# Patient Record
Sex: Female | Born: 1975
Health system: Southern US, Community
[De-identification: ages and names within clinical notes are randomized; demographics above are authoritative.]

## PROBLEM LIST (undated history)

## (undated) DIAGNOSIS — K582 Mixed irritable bowel syndrome: Secondary | ICD-10-CM

## (undated) DIAGNOSIS — F329 Major depressive disorder, single episode, unspecified: Secondary | ICD-10-CM

## (undated) DIAGNOSIS — F419 Anxiety disorder, unspecified: Secondary | ICD-10-CM

## (undated) DIAGNOSIS — D219 Benign neoplasm of connective and other soft tissue, unspecified: Secondary | ICD-10-CM

## (undated) DIAGNOSIS — G4733 Obstructive sleep apnea (adult) (pediatric): Secondary | ICD-10-CM

## (undated) DIAGNOSIS — D649 Anemia, unspecified: Secondary | ICD-10-CM

## (undated) DIAGNOSIS — M1990 Unspecified osteoarthritis, unspecified site: Secondary | ICD-10-CM

## (undated) DIAGNOSIS — N946 Dysmenorrhea, unspecified: Secondary | ICD-10-CM

## (undated) DIAGNOSIS — B019 Varicella without complication: Secondary | ICD-10-CM

## (undated) DIAGNOSIS — F32A Depression, unspecified: Secondary | ICD-10-CM

## (undated) DIAGNOSIS — N939 Abnormal uterine and vaginal bleeding, unspecified: Secondary | ICD-10-CM

## (undated) DIAGNOSIS — J45909 Unspecified asthma, uncomplicated: Secondary | ICD-10-CM

## (undated) DIAGNOSIS — J301 Allergic rhinitis due to pollen: Secondary | ICD-10-CM

## (undated) HISTORY — PX: ANTERIOR CRUCIATE LIGAMENT REPAIR: SHX115

## (undated) HISTORY — PX: EYE SURGERY: SHX253

## (undated) HISTORY — DX: Benign neoplasm of connective and other soft tissue, unspecified: D21.9

## (undated) HISTORY — DX: Unspecified osteoarthritis, unspecified site: M19.90

## (undated) HISTORY — DX: Allergic rhinitis due to pollen: J30.1

## (undated) HISTORY — PX: LAPAROSCOPIC BILATERAL SALPINGECTOMY: SHX5889

## (undated) HISTORY — DX: Dysmenorrhea, unspecified: N94.6

## (undated) HISTORY — DX: Anemia, unspecified: D64.9

## (undated) HISTORY — DX: Varicella without complication: B01.9

## (undated) HISTORY — PX: SALPINGECTOMY: SHX328

## (undated) HISTORY — DX: Obstructive sleep apnea (adult) (pediatric): G47.33

## (undated) HISTORY — DX: Abnormal uterine and vaginal bleeding, unspecified: N93.9

## (undated) HISTORY — DX: Mixed irritable bowel syndrome: K58.2

---

## 2010-02-28 HISTORY — PX: CHOLECYSTECTOMY: SHX55

## 2011-03-01 HISTORY — PX: LAPAROSCOPIC CHOLECYSTECTOMY: SUR755

## 2014-02-02 DIAGNOSIS — K219 Gastro-esophageal reflux disease without esophagitis: Secondary | ICD-10-CM | POA: Insufficient documentation

## 2014-02-02 DIAGNOSIS — G4733 Obstructive sleep apnea (adult) (pediatric): Secondary | ICD-10-CM | POA: Insufficient documentation

## 2014-02-02 DIAGNOSIS — F3340 Major depressive disorder, recurrent, in remission, unspecified: Secondary | ICD-10-CM | POA: Insufficient documentation

## 2014-02-02 DIAGNOSIS — F410 Panic disorder [episodic paroxysmal anxiety] without agoraphobia: Secondary | ICD-10-CM | POA: Insufficient documentation

## 2014-02-02 DIAGNOSIS — K582 Mixed irritable bowel syndrome: Secondary | ICD-10-CM | POA: Insufficient documentation

## 2014-02-02 HISTORY — DX: Mixed irritable bowel syndrome: K58.2

## 2014-02-02 HISTORY — DX: Obstructive sleep apnea (adult) (pediatric): G47.33

## 2014-02-13 DIAGNOSIS — E041 Nontoxic single thyroid nodule: Secondary | ICD-10-CM | POA: Insufficient documentation

## 2014-02-13 DIAGNOSIS — M5090 Cervical disc disorder, unspecified, unspecified cervical region: Secondary | ICD-10-CM | POA: Insufficient documentation

## 2014-05-21 ENCOUNTER — Encounter: Payer: Self-pay | Admitting: Obstetrics and Gynecology

## 2015-02-03 LAB — CBC AND DIFFERENTIAL
Neutrophils Absolute: 4
WBC: 7.1

## 2015-02-03 LAB — BASIC METABOLIC PANEL
BUN: 11 (ref 4–21)
CREATININE: 0.6 (ref 0.5–1.1)
GLUCOSE: 105
SODIUM: 139 (ref 137–147)

## 2015-02-03 LAB — HEPATIC FUNCTION PANEL: BILIRUBIN, TOTAL: 0.4

## 2015-02-03 LAB — IRON,TIBC AND FERRITIN PANEL
IRON: 114
UIBC: 221

## 2015-05-08 DIAGNOSIS — R5383 Other fatigue: Secondary | ICD-10-CM | POA: Insufficient documentation

## 2015-05-08 DIAGNOSIS — F419 Anxiety disorder, unspecified: Secondary | ICD-10-CM | POA: Insufficient documentation

## 2015-05-08 DIAGNOSIS — M722 Plantar fascial fibromatosis: Secondary | ICD-10-CM | POA: Insufficient documentation

## 2015-05-08 DIAGNOSIS — D509 Iron deficiency anemia, unspecified: Secondary | ICD-10-CM | POA: Insufficient documentation

## 2015-07-15 DIAGNOSIS — R233 Spontaneous ecchymoses: Secondary | ICD-10-CM | POA: Insufficient documentation

## 2015-07-15 DIAGNOSIS — D259 Leiomyoma of uterus, unspecified: Secondary | ICD-10-CM | POA: Insufficient documentation

## 2015-07-15 DIAGNOSIS — R238 Other skin changes: Secondary | ICD-10-CM | POA: Insufficient documentation

## 2015-07-31 DIAGNOSIS — R112 Nausea with vomiting, unspecified: Secondary | ICD-10-CM | POA: Insufficient documentation

## 2015-07-31 DIAGNOSIS — J45909 Unspecified asthma, uncomplicated: Secondary | ICD-10-CM | POA: Insufficient documentation

## 2015-08-17 DIAGNOSIS — Z9079 Acquired absence of other genital organ(s): Secondary | ICD-10-CM | POA: Insufficient documentation

## 2016-10-11 ENCOUNTER — Emergency Department (HOSPITAL_COMMUNITY)
Admission: EM | Admit: 2016-10-11 | Discharge: 2016-10-11 | Disposition: A | Discharge status: Home / Self Care | Payer: PRIVATE HEALTH INSURANCE | Attending: Physician Assistant | Admitting: Physician Assistant

## 2016-10-11 ENCOUNTER — Encounter (HOSPITAL_COMMUNITY): Payer: Self-pay

## 2016-10-11 ENCOUNTER — Other Ambulatory Visit: Payer: Self-pay | Admitting: Occupational Medicine

## 2016-10-11 ENCOUNTER — Ambulatory Visit: Payer: Self-pay

## 2016-10-11 ENCOUNTER — Emergency Department (HOSPITAL_COMMUNITY): Payer: PRIVATE HEALTH INSURANCE

## 2016-10-11 DIAGNOSIS — M542 Cervicalgia: Secondary | ICD-10-CM

## 2016-10-11 DIAGNOSIS — F329 Major depressive disorder, single episode, unspecified: Secondary | ICD-10-CM | POA: Diagnosis not present

## 2016-10-11 DIAGNOSIS — S161XXA Strain of muscle, fascia and tendon at neck level, initial encounter: Secondary | ICD-10-CM | POA: Diagnosis not present

## 2016-10-11 DIAGNOSIS — T148XXA Other injury of unspecified body region, initial encounter: Secondary | ICD-10-CM

## 2016-10-11 DIAGNOSIS — Y998 Other external cause status: Secondary | ICD-10-CM | POA: Insufficient documentation

## 2016-10-11 DIAGNOSIS — W01198A Fall on same level from slipping, tripping and stumbling with subsequent striking against other object, initial encounter: Secondary | ICD-10-CM | POA: Insufficient documentation

## 2016-10-11 DIAGNOSIS — Y929 Unspecified place or not applicable: Secondary | ICD-10-CM | POA: Insufficient documentation

## 2016-10-11 DIAGNOSIS — S0993XA Unspecified injury of face, initial encounter: Secondary | ICD-10-CM | POA: Diagnosis present

## 2016-10-11 DIAGNOSIS — J45909 Unspecified asthma, uncomplicated: Secondary | ICD-10-CM | POA: Diagnosis not present

## 2016-10-11 DIAGNOSIS — J3489 Other specified disorders of nose and nasal sinuses: Secondary | ICD-10-CM

## 2016-10-11 DIAGNOSIS — Y9389 Activity, other specified: Secondary | ICD-10-CM | POA: Diagnosis not present

## 2016-10-11 DIAGNOSIS — Z9049 Acquired absence of other specified parts of digestive tract: Secondary | ICD-10-CM | POA: Insufficient documentation

## 2016-10-11 DIAGNOSIS — F419 Anxiety disorder, unspecified: Secondary | ICD-10-CM | POA: Insufficient documentation

## 2016-10-11 DIAGNOSIS — S0083XA Contusion of other part of head, initial encounter: Secondary | ICD-10-CM | POA: Diagnosis not present

## 2016-10-11 DIAGNOSIS — W19XXXA Unspecified fall, initial encounter: Secondary | ICD-10-CM

## 2016-10-11 HISTORY — DX: Major depressive disorder, single episode, unspecified: F32.9

## 2016-10-11 HISTORY — DX: Depression, unspecified: F32.A

## 2016-10-11 HISTORY — DX: Unspecified asthma, uncomplicated: J45.909

## 2016-10-11 HISTORY — DX: Anxiety disorder, unspecified: F41.9

## 2016-10-11 MED ORDER — METHOCARBAMOL 500 MG PO TABS: 500.0000 mg | ORAL_TABLET | Freq: Two times a day (BID) | ORAL | 0 refills | Status: DC

## 2016-10-11 MED ORDER — IBUPROFEN 400 MG PO TABS
600.0000 mg | ORAL_TABLET | Freq: Once | ORAL | Status: AC
  Administered 2016-10-11: 600 mg via ORAL
  Filled 2016-10-11: qty 1

## 2016-10-11 NOTE — ED Notes (Signed)
Pt reports being sent over from [facility] where she was evaluated. Pt had Xray of nose and back complete. Pt was sent to have CT completed.

## 2016-10-11 NOTE — ED Notes (Signed)
Returned from CT.

## 2016-10-11 NOTE — ED Notes (Signed)
Pt verbalized understanding discharge instructions and denies any further needs or questions at this time. VS stable, ambulatory and steady gait.   

## 2016-10-11 NOTE — Discharge Instructions (Signed)
You can take Tylenol or Ibuprofen as directed for pain.  Take Robaxin as prescribed. This medication will may make you drowsy so do not drive or drink alcohol when taking it for the first time.   As we discussed, apply ice to the affected area for the first 48 hours. After that he may apply heat.  Return the emergency Department for any worsening pain, numbness/weakness of her arms or legs, urinary or bowel accidents, difficulty walking, difficulty breathing,or any other worsening or concerning symptoms.

## 2016-10-11 NOTE — ED Triage Notes (Signed)
Per Pt, Pt was walking to get on the elevator when her shoe caught and she ran into the wall with her face. Pt was unable to cath herself. She heard nose crack and has some swelling noted with a small laceration. Bleeding controlled.

## 2016-10-11 NOTE — ED Provider Notes (Signed)
[place] DEPT Provider Note   CSN: [id number] Arrival date & time: 10/11/16  9326  By signing my name below, I, Cindy Andrade, attest that this documentation has been prepared under the direction and in the presence of Providence Lanius, PA-C.  Electronically Signed: Reola Andrade, ED Scribe. 10/11/16. 12:45 PM.  History   Chief Complaint Chief Complaint  Patient presents with  . Fall   The history is provided by the patient. No language interpreter was used.    HPI Comments: Cindy Andrade is a 41 y.o. female with no pertinent PMHx, who presents to the Emergency Department complaining of sudden onset, worsening facial, neck, and b/l shoulder pain beginning s/p ground-level, mechanical fall which occurred PTA. Pt reports that she dropped an item, and while attempting to bend forward and grab this off the ground she tripped and fell forwards several feet, striking her face on a wall in front of her. She reports that she did hear a cracking noise upon impact with the wall within her face and she sustained a minor wound to the nose as well. No LOC. Pt reports that since the incident that she has experienced posterior neck and b/l shoulder pain. Pt was immediately evaluated in employee health where she had an XR performed; she was advised to come into the ED for CT imaging. Her pain is worse with movement of the torso and neck. No treatments for her pain were tried prior to coming into the ED. No PSHx to the back. No IVDU. She denies numbness, weakness, bowel/bladder incontinence, saddle anaesthesia/paraesthesias, or any other associated symptoms. Last tdap was in 2012.   Past Medical History:  Diagnosis Date  . Anxiety   . Asthma   . Depression    There are no active problems to display for this patient.  Past Surgical History:  Procedure Laterality Date  . ANTERIOR CRUCIATE LIGAMENT REPAIR    . CHOLECYSTECTOMY    . Salpingectomy     OB History    No data available     Home Medications    Prior to Admission medications   Medication Sig Start Date End Date Taking? Authorizing Provider  methocarbamol (ROBAXIN) 500 MG tablet Take 1 tablet (500 mg total) by mouth 2 (two) times daily. 10/11/16   Volanda Napoleon, PA-C   Family History No family history on file.  Social History Social History  Substance Use Topics  . Smoking status: Never Smoker  . Smokeless tobacco: Never Used  . Alcohol use Yes     Comment: Occasionally   Allergies   Neomycin and Other  Review of Systems Review of Systems  Constitutional: Negative for fever.  Respiratory: Negative for cough and shortness of breath.   Cardiovascular: Negative for chest pain.  Gastrointestinal: Negative for abdominal pain, nausea and vomiting.  Genitourinary: Negative for dysuria and hematuria.  Musculoskeletal: Positive for arthralgias, myalgias and neck pain.  Skin: Positive for wound.  Neurological: Negative for weakness, numbness and headaches.   Physical Exam Updated Vital Signs BP (!) 177/78 (BP Location: Right Arm)   Pulse 77   Temp 98.7 F (37.1 C) (Oral)   Resp 17   Ht 5\' 3"  (1.6 m)   Wt 111.1 kg (245 lb)   LMP 10/11/2016   SpO2 98%   BMI 43.40 kg/m   Physical Exam  Constitutional: She appears well-developed and well-nourished.  Sitting comfortably on examination table  HENT:  Head: Normocephalic.  No septal hematoma. Small, <1cm superficial abrasion tot he  nasal bridge. Some diffuse swelling to the nasal bridge. No facial instability. No trismus. Full elevation/depression of mandible intact without difficult. No tenderness to palpation of skull. No deformities or crepitus noted. No open wounds, abrasions or lacerations.   Eyes: Pupils are equal, round, and reactive to light. Conjunctivae and EOM are normal. Right eye exhibits no discharge. Left eye exhibits no discharge. No scleral icterus.  Neck: Normal range of motion.  Full flexion/extension and lateral movement of  neck fully intact. Some midline tenderness to the mid-cervical region. No deformities or crepitus.   Cardiovascular: Normal rate, regular rhythm and normal heart sounds.   No murmur heard. Pulses:      Radial pulses are 2+ on the right side, and 2+ on the left side.  Pulmonary/Chest: Effort normal and breath sounds normal. No respiratory distress. She has no wheezes. She has no rales.  No evidence of respiratory distress. Able to speak in full sentences without difficulty.  Musculoskeletal:  No tenderness to palpation to bilateral knees and ankles. No deformities or crepitus noted. FROM of BLE without any difficulty. No tenderness to palpation to bilateral shoulders, clavicles, elbows, and wrists. No deformities or crepitus noted. FROM of BUE without difficulty.   Neurological: She is alert.  Skin: Skin is warm and dry.  Psychiatric: She has a normal mood and affect. Her speech is normal and behavior is normal.  Nursing note and vitals reviewed.  ED Treatments / Results  DIAGNOSTIC STUDIES: Oxygen Saturation is 98% on RA, normal by my interpretation.   COORDINATION OF CARE: 12:45 PM-Discussed next steps with pt. Pt verbalized understanding and is agreeable with the plan.   Labs (all labs ordered are listed, but only abnormal results are displayed) Labs Reviewed - No data to display  EKG  EKG Interpretation None      Radiology Dg Nasal Bones  Result Date: 10/11/2016 CLINICAL DATA:  Status post fall today striking the nose EXAM: NASAL BONES - 3+ VIEW COMPARISON:  None in PACs FINDINGS: There are lucencies within the nasal bone which appear to reflect sutures. I cannot exclude a nondisplaced fracture however. The nasal spine appears intact. The nasal septum is deviated slightly toward the right. The ethmoid and maxillary sinuses appear clear. IMPRESSION: There is no acute displaced nasal bone fracture. I cannot exclude a non depressed fracture. Electronically Signed   By: David   Martinique M.D.   On: 10/11/2016 08:54   Dg Cervical Spine Complete  Addendum Date: 10/11/2016   ADDENDUM REPORT: 10/11/2016 09:22 ADDENDUM: Critical Value/emergent results were called by telephone at the time of interpretation on 10/11/2016 at 9:21 am to Dr. Maylon Peppers, who verbally acknowledged these results. Electronically Signed   By: Marijo Conception, M.D.   On: 10/11/2016 09:22   Result Date: 10/11/2016 CLINICAL DATA:  Neck pain after fall today. EXAM: CERVICAL SPINE - COMPLETE 4+ VIEW COMPARISON:  None. FINDINGS: Posterior angulation of the dens is noted with slight irregularity seen anteriorly suggesting possible fracture. Mild reversal of normal lordosis is noted most likely positional in origin. Moderate degenerative disc disease is noted at C4-5, C5-6 and C6-7 with anterior osteophyte formation. Moderate neural foraminal stenosis is noted on the right at C6-7 secondary to uncovertebral spurring. IMPRESSION: Multilevel degenerative disc disease is noted. Moderate right-sided neural foraminal stenosis is noted at C6-7 secondary to uncovertebral spurring. Posterior angulation of the dens is noted with slight irregularity seen anteriorly suggesting possible fracture; CT scan is recommended for further evaluation. All attempts to reach someone  at the phone number for Occupational Health at [facility] or unsuccessful. I left voicemail messages with the receptionist and nurse that I needed to speak immediately with Dr. Geoffry Paradise. These results will be called to the ordering clinician or representative by the Radiologist Assistant, and communication documented in the PACS or zVision Dashboard. Electronically Signed: By: Marijo Conception, M.D. On: 10/11/2016 09:09   Ct Cervical Spine Wo Contrast  Result Date: 10/11/2016 CLINICAL DATA:  [place] and injured neck. EXAM: CT CERVICAL SPINE WITHOUT CONTRAST TECHNIQUE: Multidetector CT imaging of the cervical spine was performed without intravenous contrast. Multiplanar CT image  reconstructions were also generated. COMPARISON:  Cervical spine radiographs 10/11/2016 FINDINGS: Alignment: Reversal of the normal cervical lordosis likely due to the head for position the patient. This could also be seen with muscle spasm or pain. The overall alignment is maintained. Skull base and vertebrae: No acute fracture is identified. Soft tissues and spinal canal: No abnormal prevertebral soft tissue swelling. The spinal canal is generous in is no significant spinal stenosis. Disc levels: No significant disc protrusions or canal stenosis. Advanced degenerative changes for age with osteophytic spurring/marginal osteophytes. Significant uncinate spurring changes on the right side the the at C6-7 with moderate right foraminal stenosis. Midline calcifications suspicious for early OPLL. Upper chest: No significant findings. Other: 3 cm right thyroid mass. Recommend ultrasound correlation and consideration for biopsy. IMPRESSION: 1. Reversal of the normal cervical lordosis likely due to positioning, muscle spasm or pain. The overall alignment is maintained. 2. Age advanced degenerative changes but no acute fracture. 3. Significant uncinate spurring changes on the right at C6-7 with moderate right foraminal stenosis. 4. **An incidental finding of potential clinical significance has been found. 3 cm right thyroid lobe mass. Recommend ultrasound correlation and consideration for biopsy.** Electronically Signed   By: Marijo Sanes M.D.   On: 10/11/2016 14:20   Procedures Procedures   Medications Ordered in ED Medications  ibuprofen (ADVIL,MOTRIN) tablet 600 mg (600 mg Oral Given 10/11/16 1304)    Initial Impression / Assessment and Plan / ED Course  I have reviewed the triage vital signs and the nursing notes.  Pertinent labs & imaging results that were available during my care of the patient were reviewed by me and considered in my medical decision making (see chart for details).     41 year old  female who presents after a mechanical fall that occurred earlier this morning. Patient had neck x-ray done at employee health and there is concerned about a possible dens fracture that could not be definitely confirmed. Patient was sent to the emergency department for further CT imaging. Patient is afebrile, non-toxic appearing, sitting comfortably on examination table. Vital signs reviewed. Patient slightly hypertensive, likely secondary to pain. No neuro deficits on exam. No history of LOC, vomiting, abnormal behavior. No difficulty in relating. Plan to obtain CT cervical spine for further evaluation. Given reassuring physical exam and per [facility] CT criteria, no imaging is indicated at this time. Patient had nasal x-rays performed and play health were negative for any acute fracture or dislocation. There is no evidence of a septal hematoma. Discussed further imaging of the nasal bone, but explained that with the swelling, there may not be an acute finding. Patient wishes to decline at this time. Analgesics provided the department.  CT reviewed. Negative for any acute fracture. There is mention of possible muscle spasm. There is also an incidental finding of a 3cm thyroid nodule mass that is recommended to have outpatient ultrasound  and biopsy done. Discussed results with patient. Patient states that she is already aware of the thyroid nodule and has been evaluated by ultrasound and biopsy for it. Patient hemodynamically stable. Patient is following up with ENT as per employee health recommendation. Will provide symptomatic relief. Conservative therapies discussed. Strict return precautions discussed. Patient expresses understanding and agreement to plan.    Final Clinical Impressions(s) / ED Diagnoses   Final diagnoses:  Fall, initial encounter  Contusion of face, initial encounter  Neck pain  Muscle strain   New Prescriptions New Prescriptions   METHOCARBAMOL (ROBAXIN) 500 MG TABLET    Take  1 tablet (500 mg total) by mouth 2 (two) times daily.   I personally performed the services described in this documentation, which was scribed in my presence. The recorded information has been reviewed and is accurate.     Volanda Napoleon, PA-C 10/11/16 1727    Volanda Napoleon, PA-C 10/11/16 1728    Volanda Napoleon, PA-C 10/11/16 1729    Macarthur Critchley, MD 10/12/16 1050

## 2016-10-17 DIAGNOSIS — S022XXD Fracture of nasal bones, subsequent encounter for fracture with routine healing: Secondary | ICD-10-CM | POA: Insufficient documentation

## 2016-10-17 DIAGNOSIS — J343 Hypertrophy of nasal turbinates: Secondary | ICD-10-CM | POA: Insufficient documentation

## 2016-10-17 DIAGNOSIS — J342 Deviated nasal septum: Secondary | ICD-10-CM | POA: Insufficient documentation

## 2016-11-01 DIAGNOSIS — F4323 Adjustment disorder with mixed anxiety and depressed mood: Secondary | ICD-10-CM | POA: Diagnosis not present

## 2016-11-10 DIAGNOSIS — M5012 Mid-cervical disc disorder, unspecified level: Secondary | ICD-10-CM | POA: Diagnosis not present

## 2016-11-10 DIAGNOSIS — S060X1A Concussion with loss of consciousness of 30 minutes or less, initial encounter: Secondary | ICD-10-CM | POA: Diagnosis not present

## 2016-11-10 DIAGNOSIS — S134XXA Sprain of ligaments of cervical spine, initial encounter: Secondary | ICD-10-CM | POA: Diagnosis not present

## 2016-11-10 DIAGNOSIS — M5412 Radiculopathy, cervical region: Secondary | ICD-10-CM | POA: Diagnosis not present

## 2016-11-15 DIAGNOSIS — F4323 Adjustment disorder with mixed anxiety and depressed mood: Secondary | ICD-10-CM | POA: Diagnosis not present

## 2016-11-24 ENCOUNTER — Ambulatory Visit: Payer: PRIVATE HEALTH INSURANCE | Attending: Family Medicine | Admitting: Physical Therapy

## 2016-11-24 ENCOUNTER — Encounter: Payer: Self-pay | Admitting: Physical Therapy

## 2016-11-24 DIAGNOSIS — M6281 Muscle weakness (generalized): Secondary | ICD-10-CM | POA: Insufficient documentation

## 2016-11-24 DIAGNOSIS — M25512 Pain in left shoulder: Secondary | ICD-10-CM | POA: Diagnosis present

## 2016-11-24 DIAGNOSIS — M25511 Pain in right shoulder: Secondary | ICD-10-CM | POA: Diagnosis present

## 2016-11-24 DIAGNOSIS — M542 Cervicalgia: Secondary | ICD-10-CM | POA: Insufficient documentation

## 2016-11-24 NOTE — Therapy (Addendum)
[place], [place], [address] Phone: [phone]   Fax:  [phone]  Physical Therapy Evaluation  Patient Details  Name: Cindy Andrade MRN: [id number] Date of Birth: 07-30-1975 Referring Provider: Odis Luster, MD  Encounter Date: 11/24/2016      PT End of Session - 11/24/16 1417    Visit Number 1   Number of Visits 6   Date for PT Re-Evaluation 12/21/16   Authorization Type Workers comp- [employer] [facility])   phone [phone], [phone]  Claim #[id number]   Authorization Time Period 6 visits   PT Start Time 1330   PT Stop Time 1417   PT Time Calculation (min) 47 min   Activity Tolerance Patient tolerated treatment well   Behavior During Therapy [facility] for tasks assessed/performed      Past Medical History:  Diagnosis Date  . Anxiety   . Asthma   . Depression     Past Surgical History:  Procedure Laterality Date  . ANTERIOR CRUCIATE LIGAMENT REPAIR    . CHOLECYSTECTOMY    . Salpingectomy      There were no vitals filed for this visit.       Subjective Assessment - 11/24/16 1334    Subjective Has had neck pain for years but is not back to baseline at this point. Has been seeing a chiropractor but it does not seem to be getting better this time around. No pain in neutral, still posture but feels very tight. Neck overall just doesn't feel mobile. Has had an increase in HA pain, around ears and to forehead. Reports N/T bill that has increased since accident, feels that it is more the L than R, seems to be positionally dependent. 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    Patient Stated Goals turning R/L, working, household chores, reduce HA pain   Currently in Pain? Yes   Pain Score 1    Pain Location Neck   Pain Orientation Right;Left   Pain Descriptors / Indicators Tightness   Pain Type Acute pain   Aggravating Factors  turning head, sitting  long periods   Pain Relieving Factors motrin            [facility] PT Assessment - 11/24/16 0001      Assessment   Medical Diagnosis neck pain   Referring Provider Odis Luster, MD   Onset Date/Surgical Date 10/11/16   Hand Dominance Right   Next MD Visit --  3 weeks   Prior Therapy --  not PT, has had chiropractic     Precautions   Precautions None     Restrictions   Weight Bearing Restrictions No     Balance Screen   Has the patient fallen in the past 6 months Yes   How many times? 1   Has the patient had a decrease in activity level because of a fear of falling?  No   Is the patient reluctant to leave their home because of a fear of falling?  No     Home Ecologist residence     Prior Function   Level of Independence Independent   Vocation Full time employment   Lobbyist     Cognition   Overall Cognitive Status Within Functional Limits for tasks assessed     Observation/Other Assessments   Focus on Therapeutic Outcomes (FOTO)  35% limitation     Sensation   Additional  Comments occasional- bilat hands     ROM / Strength   AROM / PROM / Strength AROM;Strength     AROM   AROM Assessment Site Cervical   Cervical Flexion 42   Cervical Extension 50   Cervical - Right Side Bend 32  tight   Cervical - Left Side Bend 34  tight   Cervical - Right Rotation 36   Cervical - Left Rotation 45  pain on R     Strength   Strength Assessment Site Shoulder   Right/Left Shoulder Right;Left   Right Shoulder External Rotation 3+/5   Left Shoulder External Rotation 3+/5            Objective measurements completed on examination: See above findings.          [place] Adult PT Treatment/Exercise - 11/24/16 0001      Therapeutic Activites    Therapeutic Activities Work [employer]   Work Customer service manager up     Exercises   Exercises Shoulder     Shoulder Exercises: Seated   Retraction  Limitations scapular retraction   External Rotation 20 reps   Theraband Level (Shoulder External Rotation) Level 1 (Yellow)     Shoulder Exercises: Stretch   Other Shoulder Stretches upper trap & levator stretch                PT Education - 11/24/16 1417    Education provided Yes   Education Details theracane, tennis ball, ergonomics, anatomy of condition, POC, HEP, exercise form/rationale   Person(s) Educated Patient   Methods Explanation;Demonstration;Tactile cues;Verbal cues;Handout   Comprehension Verbalized understanding;Returned demonstration;Verbal cues required;Tactile cues required;Need further instruction             PT Long Term Goals - 11/24/16 1424      PT LONG TERM GOAL #1   Title Bilateral cervical rotation to at least 50 deg for functional rotation    Baseline see flowsheet   Time 4   Period Weeks   Status New   Target Date 12/23/16     PT LONG TERM GOAL #2   Title Pt will be able to reach overhead for high cabinets without increase in neck/shoulder pain   Baseline pain at eval   Time 4   Period Weeks   Status New   Target Date 12/23/16     PT LONG TERM GOAL #3   Title Pt will be able to complete all work related activities without limitation by neck/shoulder pain   Baseline limited at eval   Time 4   Period Weeks   Status New   Target Date 12/23/16     PT LONG TERM GOAL #4   Title Pt will report resolution of HA pain caused by myofascial pain referred from neck/shoulders   Baseline frequent HA since accident in rams horn pattern   Time 4   Period Weeks   Status New   Target Date 12/23/16     PT LONG TERM GOAL #5   Title GHJ ER strength to 5/5 to indicate appropriate strength for periscapular support   Baseline 3+/5 at eval   Time 4   Period Weeks   Status New   Target Date 12/23/16                Plan - 11/24/16 1346    Clinical Impression Statement Pt presents to PT with complaints of cervical pain extending into  bilateral UE that increased after a fall about 1 month ago. Notable  tightness along bilateral upper traps and levator scapula with forward head and excessively erect posture. Weakness in bilateral GHJ ER indicating poor periscapular support. Pt works on a computer all day at work and Personal assistant were discussed today. Pt will benefit from skilled PT in order to improve periscapular strength/endurance for proper postural alignment and manual techniques to decrease myofascial pain.    History and Personal Factors relevant to plan of care: depression, anxiety, bilat ACL recon 09 & 11, h/o asthma, goiter   Clinical Presentation Stable   Clinical Presentation due to: n/a   Clinical Decision Making Low   Rehab Potential Good   PT Frequency 2x / week   PT Duration 3 weeks   PT Treatment/Interventions ADLs/Self Care Home Management;Cryotherapy;Electrical Stimulation;Iontophoresis 4mg /ml Dexamethasone;Functional mobility training;Ultrasound;Traction;Moist Heat;Therapeutic activities;Therapeutic exercise;Neuromuscular re-education;Patient/family education;Passive range of motion;Manual techniques;Dry needling;Taping   PT Next Visit Plan DN bilat upper traps, deep neck flexor training, periscapular strengthening, pectoralis stretching, changed posture at work?   PT Home Exercise Plan scapular retraction, bil UE ER, upper trap & levator stretch   Consulted and Agree with Plan of Care Patient      Patient will benefit from skilled therapeutic intervention in order to improve the following deficits and impairments:  Decreased range of motion, Impaired UE functional use, Increased muscle spasms, Decreased activity tolerance, Pain, Impaired flexibility, Improper body mechanics, Postural dysfunction, Impaired sensation, Decreased strength  Visit Diagnosis: Cervicalgia - Plan: PT plan of care cert/re-cert  Acute pain of right shoulder - Plan: PT plan of care cert/re-cert  Acute pain of left shoulder - Plan: PT  plan of care cert/re-cert  Muscle weakness (generalized) - Plan: PT plan of care cert/re-cert     Problem List There are no active problems to display for this patient.   Elmus Mathes C. Jveon Pound PT, DPT 11/24/16 5:32 PM   [place] [facility] [address] [place], [place], [address] Phone: [phone]   Fax:  [phone]  Name: Tovah Slavick MRN: [id number] Date of Birth: 1975/04/05

## 2016-11-29 ENCOUNTER — Ambulatory Visit: Payer: PRIVATE HEALTH INSURANCE | Attending: Family Medicine | Admitting: Physical Therapy

## 2016-11-29 ENCOUNTER — Encounter: Payer: Self-pay | Admitting: Physical Therapy

## 2016-11-29 DIAGNOSIS — M25512 Pain in left shoulder: Secondary | ICD-10-CM | POA: Diagnosis present

## 2016-11-29 DIAGNOSIS — M6281 Muscle weakness (generalized): Secondary | ICD-10-CM | POA: Diagnosis present

## 2016-11-29 DIAGNOSIS — M25511 Pain in right shoulder: Secondary | ICD-10-CM | POA: Insufficient documentation

## 2016-11-29 DIAGNOSIS — M542 Cervicalgia: Secondary | ICD-10-CM | POA: Diagnosis present

## 2016-11-29 DIAGNOSIS — F4323 Adjustment disorder with mixed anxiety and depressed mood: Secondary | ICD-10-CM | POA: Diagnosis not present

## 2016-11-29 NOTE — Therapy (Signed)
[place], [place], [address] Phone: [phone]   Fax:  [phone]  Physical Therapy Treatment  Patient Details  Name: Cindy Andrade MRN: [id number] Date of Birth: 1975-04-06 Referring Provider: Odis Luster, MD  Encounter Date: 11/29/2016      PT End of Session - 11/29/16 0927    Visit Number 2   Number of Visits 6   Date for PT Re-Evaluation 12/21/16   Authorization Type Workers comp- [employer] [facility])   phone [phone], [phone]  Claim #[id number]   Authorization Time Period 6 visits   PT Start Time 0930   PT Stop Time 1023   PT Time Calculation (min) 53 min   Activity Tolerance Patient tolerated treatment well   Behavior During Therapy [facility] for tasks assessed/performed      Past Medical History:  Diagnosis Date  . Anxiety   . Asthma   . Depression     Past Surgical History:  Procedure Laterality Date  . ANTERIOR CRUCIATE LIGAMENT REPAIR    . CHOLECYSTECTOMY    . Salpingectomy      There were no vitals filed for this visit.      Subjective Assessment - 11/29/16 0930    Subjective Reports neck feels sore after doing scpaular retractions. Points to R upper trap to indicate site of pain today. A little L-sided HA today.    Patient Stated Goals turning R/L, working, household chores, reduce HA pain            [facility] PT Assessment - 11/29/16 0001      AROM   Cervical - Right Rotation 52   Cervical - Left Rotation 50                     [facility] Adult PT Treatment/Exercise - 11/29/16 0001      Shoulder Exercises: Seated   Other Seated Exercises cervical retraction   Other Seated Exercises cervical rotation SNAGs     Shoulder Exercises: Stretch   Other Shoulder Stretches upper trap & levator stretch   Other Shoulder Stretches thoracic ext in chair     Modalities   Modalities Moist Heat     Moist Heat Therapy   Number Minutes Moist Heat 10  Minutes   Moist Heat Location Cervical     Manual Therapy   Manual Therapy Soft tissue mobilization   Manual therapy comments skilled palpation & monitoring during TPDN   Soft tissue mobilization bilateral upper traps & levator           Trigger Point Dry Needling - 11/29/16 1314    Consent Given? Yes   Education Handout Provided --  verbal education   Muscles Treated Upper Body Upper trapezius   Upper Trapezius Response Twitch reponse elicited;Palpable increased muscle length              PT Education - 11/29/16 1014    Education provided Yes   Education Details TPDN & expected outcomes, rationale for exercises, importance of regular mobility   Person(s) Educated Patient   Methods Explanation;Demonstration;Tactile cues;Verbal cues;Handout   Comprehension Verbalized understanding;Returned demonstration;Verbal cues required;Tactile cues required;Need further instruction             PT Long Term Goals - 11/24/16 1424      PT LONG TERM GOAL #1   Title Bilateral cervical rotation to at least 50 deg for functional rotation    Baseline see flowsheet   Time 4  Period Weeks   Status New   Target Date 12/23/16     PT LONG TERM GOAL #2   Title Pt will be able to reach overhead for high cabinets without increase in neck/shoulder pain   Baseline pain at eval   Time 4   Period Weeks   Status New   Target Date 12/23/16     PT LONG TERM GOAL #3   Title Pt will be able to complete all work related activities without limitation by neck/shoulder pain   Baseline limited at eval   Time 4   Period Weeks   Status New   Target Date 12/23/16     PT LONG TERM GOAL #4   Title Pt will report resolution of HA pain caused by myofascial pain referred from neck/shoulders   Baseline frequent HA since accident in rams horn pattern   Time 4   Period Weeks   Status New   Target Date 12/23/16     PT LONG TERM GOAL #5   Title GHJ ER strength to 5/5 to indicate appropriate  strength for periscapular support   Baseline 3+/5 at eval   Time 4   Period Weeks   Status New   Target Date 12/23/16               Plan - 11/29/16 1015    Clinical Impression Statement Concordant pain found in trigger points of upper traps and reduced with TPDN. Pt verbalized soreness as expected and was educated on importance of frequent mobility and exercises to reduce soreness.    PT Treatment/Interventions ADLs/Self Care Home Management;Cryotherapy;Electrical Stimulation;Iontophoresis 4mg /ml Dexamethasone;Functional mobility training;Ultrasound;Traction;Moist Heat;Therapeutic activities;Therapeutic exercise;Neuromuscular re-education;Patient/family education;Passive range of motion;Manual techniques;Dry needling;Taping   PT Next Visit Plan DN outcomes?  deep neck flexor training, periscapular strengthening, pectoralis stretching, posture/exercises at work?   PT Home Exercise Plan scapular retraction, bil UE ER, upper trap & levator stretch, cervical rotation SNAG   Consulted and Agree with Plan of Care Patient      Patient will benefit from skilled therapeutic intervention in order to improve the following deficits and impairments:  Decreased range of motion, Impaired UE functional use, Increased muscle spasms, Decreased activity tolerance, Pain, Impaired flexibility, Improper body mechanics, Postural dysfunction, Impaired sensation, Decreased strength  Visit Diagnosis: Cervicalgia  Acute pain of right shoulder  Acute pain of left shoulder  Muscle weakness (generalized)     Problem List There are no active problems to display for this patient.   Sharlett Lienemann C. Reynol Arnone PT, DPT 11/29/16 1:15 PM   [facility] [address] [place], [place], [address] Phone: [phone]   Fax:  [phone]  Name: Cindy Andrade MRN: [id number] Date of Birth: 14-Dec-1975

## 2016-12-01 ENCOUNTER — Ambulatory Visit: Payer: PRIVATE HEALTH INSURANCE | Attending: Family Medicine | Admitting: Physical Therapy

## 2016-12-01 ENCOUNTER — Encounter: Payer: Self-pay | Admitting: Physical Therapy

## 2016-12-01 DIAGNOSIS — M25511 Pain in right shoulder: Secondary | ICD-10-CM

## 2016-12-01 DIAGNOSIS — M542 Cervicalgia: Secondary | ICD-10-CM | POA: Diagnosis present

## 2016-12-01 DIAGNOSIS — M6281 Muscle weakness (generalized): Secondary | ICD-10-CM | POA: Diagnosis present

## 2016-12-01 DIAGNOSIS — M25512 Pain in left shoulder: Secondary | ICD-10-CM | POA: Diagnosis present

## 2016-12-01 NOTE — Therapy (Signed)
[place], [place], [address] Phone: [phone]   Fax:  [phone]  Physical Therapy Treatment  Patient Details  Name: Cindy Andrade MRN: [id number] Date of Birth: 04-05-1975 Referring Provider: Odis Luster, MD  Encounter Date: 12/01/2016      PT End of Session - 12/01/16 1148    Visit Number 3   Number of Visits 6   Date for PT Re-Evaluation 12/21/16   Authorization Type Workers comp- [employer] [facility])   phone [phone], [phone]  Claim #[id number]   PT Start Time 1146   PT Stop Time 1229   PT Time Calculation (min) 43 min   Activity Tolerance Patient tolerated treatment well   Behavior During Therapy [facility] for tasks assessed/performed      Past Medical History:  Diagnosis Date  . Anxiety   . Asthma   . Depression     Past Surgical History:  Procedure Laterality Date  . ANTERIOR CRUCIATE LIGAMENT REPAIR    . CHOLECYSTECTOMY    . Salpingectomy      There were no vitals filed for this visit.      Subjective Assessment - 12/01/16 1148    Subjective Was very sore in bilateral upper traps. Felt a little "kink" when she turned to the R but it has calmed down. Today does not have pain in neck, soreness in shoulders is minimal.    Patient Stated Goals turning R/L, working, household chores, reduce HA pain   Currently in Pain? No/denies                         [facility] Adult PT Treatment/Exercise - 12/01/16 0001      Shoulder Exercises: Supine   Protraction 15 reps   Other Supine Exercises cerviccal retraction   Other Supine Exercises abdominal engagement +scapular retraction     Shoulder Exercises: Standing   External Rotation 20 reps;Both  single arm   Theraband Level (Shoulder External Rotation) Level 2 (Red)   Extension 20 reps   Theraband Level (Shoulder Extension) Level 2 (Red)     Shoulder Exercises: Stretch   Other Shoulder Stretches supine  pec stretch     Manual Therapy   Manual Therapy Joint mobilization;Manual Traction;Taping   Joint Mobilization cervical & lateral mobilizations gross cervical   Soft tissue mobilization bilaterall upper traps   Manual Traction cervical   Kinesiotex Facilitate Muscle     Kinesiotix   Facilitate Muscle  scapular retraction                PT Education - 12/01/16 1244    Education provided Yes   Education Details posture beginning from hips, DOMS, postural awareness, exercise form/rationale   Person(s) Educated Patient   Methods Explanation;Demonstration;Tactile cues;Verbal cues;Handout   Comprehension Verbalized understanding;Returned demonstration;Verbal cues required;Tactile cues required;Need further instruction             PT Long Term Goals - 11/24/16 1424      PT LONG TERM GOAL #1   Title Bilateral cervical rotation to at least 50 deg for functional rotation    Baseline see flowsheet   Time 4   Period Weeks   Status New   Target Date 12/23/16     PT LONG TERM GOAL #2   Title Pt will be able to reach overhead for high cabinets without increase in neck/shoulder pain   Baseline pain at eval   Time 4   Period Weeks  Status New   Target Date 12/23/16     PT LONG TERM GOAL #3   Title Pt will be able to complete all work related activities without limitation by neck/shoulder pain   Baseline limited at eval   Time 4   Period Weeks   Status New   Target Date 12/23/16     PT LONG TERM GOAL #4   Title Pt will report resolution of HA pain caused by myofascial pain referred from neck/shoulders   Baseline frequent HA since accident in rams horn pattern   Time 4   Period Weeks   Status New   Target Date 12/23/16     PT LONG TERM GOAL #5   Title GHJ ER strength to 5/5 to indicate appropriate strength for periscapular support   Baseline 3+/5 at eval   Time 4   Period Weeks   Status New   Target Date 12/23/16               Plan - 12/01/16 1236     Clinical Impression Statement upper trap soreness as expected following TPDN last visit. exercises to strengthen periscapular activation and cervical retraction. Discussed alignment beginning from hips while seated. Will continue to challenge stability & endurance for postural support.    PT Treatment/Interventions ADLs/Self Care Home Management;Cryotherapy;Electrical Stimulation;Iontophoresis 4mg /ml Dexamethasone;Functional mobility training;Ultrasound;Traction;Moist Heat;Therapeutic activities;Therapeutic exercise;Neuromuscular re-education;Patient/family education;Passive range of motion;Manual techniques;Dry needling;Taping   PT Next Visit Plan prone periscapular strengthening exercises   PT Home Exercise Plan scapular retraction, bil UE ER, upper trap & levator stretch, cervical rotation SNAG; single arm ER red tband   Consulted and Agree with Plan of Care Patient      Patient will benefit from skilled therapeutic intervention in order to improve the following deficits and impairments:  Decreased range of motion, Impaired UE functional use, Increased muscle spasms, Decreased activity tolerance, Pain, Impaired flexibility, Improper body mechanics, Postural dysfunction, Impaired sensation, Decreased strength  Visit Diagnosis: Cervicalgia  Acute pain of right shoulder  Acute pain of left shoulder  Muscle weakness (generalized)     Problem List There are no active problems to display for this patient.  Kade Demicco C. Zia Kanner PT, DPT 12/01/16 12:45 PM   [facility] [address] [place], [place], [address] Phone: [phone]   Fax:  [phone]  Name: Maryagnes Carrasco MRN: [id number] Date of Birth: 1975-07-28

## 2016-12-02 DIAGNOSIS — F411 Generalized anxiety disorder: Secondary | ICD-10-CM | POA: Diagnosis not present

## 2016-12-05 ENCOUNTER — Encounter: Payer: Self-pay | Admitting: Physical Therapy

## 2016-12-05 ENCOUNTER — Ambulatory Visit: Payer: PRIVATE HEALTH INSURANCE | Attending: Family Medicine | Admitting: Physical Therapy

## 2016-12-05 DIAGNOSIS — M25511 Pain in right shoulder: Secondary | ICD-10-CM | POA: Insufficient documentation

## 2016-12-05 DIAGNOSIS — M6281 Muscle weakness (generalized): Secondary | ICD-10-CM | POA: Diagnosis present

## 2016-12-05 DIAGNOSIS — M542 Cervicalgia: Secondary | ICD-10-CM | POA: Diagnosis not present

## 2016-12-05 DIAGNOSIS — M25512 Pain in left shoulder: Secondary | ICD-10-CM | POA: Diagnosis present

## 2016-12-05 NOTE — Therapy (Signed)
[place], [place], [address] Phone: [phone]   Fax:  [phone]  Physical Therapy Treatment  Patient Details  Name: Cindy Andrade MRN: [id number] Date of Birth: 05/18/1975 Referring Provider: Odis Luster, MD  Encounter Date: 12/05/2016      PT End of Session - 12/05/16 1153    Visit Number 4   Number of Visits 6   Date for PT Re-Evaluation 12/21/16   Authorization Type Workers comp- [employer] [facility])   phone [phone], [phone]  Claim #[id number]   Authorization Time Period 6 visits   PT Start Time 1151   PT Stop Time 1250   PT Time Calculation (min) 59 min   Activity Tolerance Patient tolerated treatment well   Behavior During Therapy [facility] for tasks assessed/performed      Past Medical History:  Diagnosis Date  . Anxiety   . Asthma   . Depression     Past Surgical History:  Procedure Laterality Date  . ANTERIOR CRUCIATE LIGAMENT REPAIR    . CHOLECYSTECTOMY    . Salpingectomy      There were no vitals filed for this visit.      Subjective Assessment - 12/05/16 1153    Subjective Was busy this weekend and may have overdone it. 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    Patient Stated Goals turning R/L, working, household chores, reduce HA pain   Currently in Pain? Yes   Pain Score 5   when moving and feels sharp pain, 1/10 at rest   Pain Location Neck   Pain Orientation Right   Pain Descriptors / Indicators [facility] Adult PT Treatment/Exercise - 12/05/16 0001      Shoulder Exercises: Supine   Flexion 15 reps   Theraband Level (Shoulder Flexion) Level 2 (Red)   Flexion Limitations horiz abd pull on tband   Other Supine Exercises triceps ext from 90/90     Shoulder Exercises: Seated   Other Seated Exercises seated thoracic ext over chair     Shoulder Exercises: Prone   Retraction Limitations  prone retraction with shoulder lift off   Extension 15 reps  prone retraction + GHJ ext     Shoulder Exercises: Stretch   Wall Stretch - Flexion Limitations flexion wall slide   Other Shoulder Stretches upper trap stretch   Other Shoulder Stretches supine pec stretch     Moist Heat Therapy   Number Minutes Moist Heat 15 Minutes   Moist Heat Location Cervical     Manual Therapy   Manual therapy comments skilled palpation & monitoring during TPDN   Soft tissue mobilization Right upper trap & rhomboids          Trigger Point Dry Needling - 12/05/16 1215    Upper Trapezius Response Twitch reponse elicited;Palpable increased muscle length              PT Education - 12/05/16 1300    Education provided Yes   Education Details postural alignment, C-T junction pain, exercise form/rationale, HEP   Person(s) Educated Patient   Methods Explanation;Demonstration;Tactile cues;Verbal cues;Handout   Comprehension Verbalized understanding;Returned demonstration;Verbal cues required;Tactile cues required;Need further instruction             PT Long Term Goals - 11/24/16 1424      PT LONG TERM GOAL #  1   Title Bilateral cervical rotation to at least 50 deg for functional rotation    Baseline see flowsheet   Time 4   Period Weeks   Status New   Target Date 12/23/16     PT LONG TERM GOAL #2   Title Pt will be able to reach overhead for high cabinets without increase in neck/shoulder pain   Baseline pain at eval   Time 4   Period Weeks   Status New   Target Date 12/23/16     PT LONG TERM GOAL #3   Title Pt will be able to complete all work related activities without limitation by neck/shoulder pain   Baseline limited at eval   Time 4   Period Weeks   Status New   Target Date 12/23/16     PT LONG TERM GOAL #4   Title Pt will report resolution of HA pain caused by myofascial pain referred from neck/shoulders   Baseline frequent HA since accident in rams horn pattern    Time 4   Period Weeks   Status New   Target Date 12/23/16     PT LONG TERM GOAL #5   Title GHJ ER strength to 5/5 to indicate appropriate strength for periscapular support   Baseline 3+/5 at eval   Time 4   Period Weeks   Status New   Target Date 12/23/16               Plan - 12/05/16 1249    Clinical Impression Statement Increased tightness in right upper trap today was decreased with DN and pt verbalized decrease in HA pain around R ear. Notable stiffness through thoracic spine and rib cage due to guarding. Limited in postural alignment by back/hip pain today which is a chronic issue and being addressed separately. concordant pain at R C-T junction where the majority of extension would have occurred base off of reports of fall. Cavitation noted with grade 3 mobilization. Will continue to benefit from periscapular strengthening and postural training to decrease pain.    PT Treatment/Interventions ADLs/Self Care Home Management;Cryotherapy;Electrical Stimulation;Iontophoresis 4mg /ml Dexamethasone;Functional mobility training;Ultrasound;Traction;Moist Heat;Therapeutic activities;Therapeutic exercise;Neuromuscular re-education;Patient/family education;Passive range of motion;Manual techniques;Dry needling;Taping   PT Next Visit Plan prone periscapular strengthening exercises, lifting, work station exercises   PT Home Exercise Plan scapular retraction, bil UE ER, upper trap & levator stretch, cervical rotation SNAG; single arm ER red tband; wall slide GHJ flx, supine horiz abd, supine flx with horiz abd pull on band   Consulted and Agree with Plan of Care Patient      Patient will benefit from skilled therapeutic intervention in order to improve the following deficits and impairments:  Decreased range of motion, Impaired UE functional use, Increased muscle spasms, Decreased activity tolerance, Pain, Impaired flexibility, Improper body mechanics, Postural dysfunction, Impaired  sensation, Decreased strength  Visit Diagnosis: Cervicalgia  Acute pain of right shoulder  Acute pain of left shoulder  Muscle weakness (generalized)     Problem List There are no active problems to display for this patient.   Annakate Soulier C. Warrene Kapfer PT, DPT 12/05/16 1:01 PM   [facility] [address] [place], [place], [address] Phone: [phone]   Fax:  [phone]  Name: Cindy Andrade MRN: [id number] Date of Birth: 1976-01-29

## 2016-12-06 MED FILL — VIIBRYD 40 MG TABLET: 40 | 90 days supply | Qty: 90 | Fill #0

## 2016-12-07 ENCOUNTER — Encounter: Payer: Self-pay | Admitting: Physical Therapy

## 2016-12-07 ENCOUNTER — Ambulatory Visit: Payer: PRIVATE HEALTH INSURANCE | Attending: Family Medicine | Admitting: Physical Therapy

## 2016-12-07 DIAGNOSIS — M25512 Pain in left shoulder: Secondary | ICD-10-CM | POA: Diagnosis present

## 2016-12-07 DIAGNOSIS — M542 Cervicalgia: Secondary | ICD-10-CM | POA: Insufficient documentation

## 2016-12-07 DIAGNOSIS — M6281 Muscle weakness (generalized): Secondary | ICD-10-CM | POA: Insufficient documentation

## 2016-12-07 DIAGNOSIS — M25511 Pain in right shoulder: Secondary | ICD-10-CM | POA: Diagnosis present

## 2016-12-07 NOTE — Therapy (Signed)
[place], [place], [address] Phone: [phone]   Fax:  [phone]  Physical Therapy Treatment  Patient Details  Name: Cindy Andrade MRN: [id number] Date of Birth: 08/16/1975 Referring Provider: Odis Luster, MD  Encounter Date: 12/07/2016      PT End of Session - 12/07/16 1239    Visit Number 5   Number of Visits 6   Date for PT Re-Evaluation 12/21/16   Authorization Type Workers comp- [employer] [facility])   phone [phone], [phone]  Claim #[id number]   Authorization Time Period 6 visits   PT Start Time 1235   PT Stop Time 1330   PT Time Calculation (min) 55 min   Activity Tolerance Patient tolerated treatment well   Behavior During Therapy [facility] for tasks assessed/performed      Past Medical History:  Diagnosis Date  . Anxiety   . Asthma   . Depression     Past Surgical History:  Procedure Laterality Date  . ANTERIOR CRUCIATE LIGAMENT REPAIR    . CHOLECYSTECTOMY    . Salpingectomy      There were no vitals filed for this visit.      Subjective Assessment - 12/07/16 1236    Subjective Stiff on the Right side today. Large pull when doing levator scapula stretch. Has been doing exercises at work.   Patient Stated Goals turning R/L, working, household chores, reduce HA pain   Currently in Pain? Yes   Pain Score 3   with movement   Pain Location Neck   Pain Orientation Right;Left  R>L   Pain Descriptors / Indicators Tightness  stiff   Aggravating Factors  turning R   Pain Relieving Factors exercises                         [facility] Adult PT Treatment/Exercise - 12/07/16 0001      Shoulder Exercises: Supine   Other Supine Exercises cervical retraction with liftoff   Other Supine Exercises retraction+ rotation     Shoulder Exercises: Seated   Theraband Level (Shoulder External Rotation) Level 2 (Red)   External Rotation Limitations elbows on  bolster     Shoulder Exercises: Prone   Retraction Limitations prone cervical retraction with lift     Shoulder Exercises: ROM/Strengthening   UBE (Upper Arm Bike) retro 4 min L1     Shoulder Exercises: Stretch   Other Shoulder Stretches levator scapula stretch   Other Shoulder Stretches suboccipital stretch     Moist Heat Therapy   Number Minutes Moist Heat 10 Minutes   Moist Heat Location Cervical     Manual Therapy   Manual therapy comments skilled palpation & monitoring during TPDN   Soft tissue mobilization bilateral cervical paraspinals & suboccipitals          Trigger Point Dry Needling - 12/07/16 1256    Muscles Treated Upper Body --  splenius capitus              PT Education - 12/07/16 1324    Education provided Yes   Education Details exercise form/rationale, postural alignment, POC, HEP   Person(s) Educated Patient   Methods Explanation;Demonstration;Tactile cues;Verbal cues;Handout   Comprehension Verbalized understanding;Returned demonstration;Verbal cues required;Tactile cues required;Need further instruction             PT Long Term Goals - 11/24/16 1424      PT LONG TERM GOAL #1   Title Bilateral cervical rotation to  at least 50 deg for functional rotation    Baseline see flowsheet   Time 4   Period Weeks   Status New   Target Date 12/23/16     PT LONG TERM GOAL #2   Title Pt will be able to reach overhead for high cabinets without increase in neck/shoulder pain   Baseline pain at eval   Time 4   Period Weeks   Status New   Target Date 12/23/16     PT LONG TERM GOAL #3   Title Pt will be able to complete all work related activities without limitation by neck/shoulder pain   Baseline limited at eval   Time 4   Period Weeks   Status New   Target Date 12/23/16     PT LONG TERM GOAL #4   Title Pt will report resolution of HA pain caused by myofascial pain referred from neck/shoulders   Baseline frequent HA since accident in  rams horn pattern   Time 4   Period Weeks   Status New   Target Date 12/23/16     PT LONG TERM GOAL #5   Title GHJ ER strength to 5/5 to indicate appropriate strength for periscapular support   Baseline 3+/5 at eval   Time 4   Period Weeks   Status New   Target Date 12/23/16               Plan - 12/07/16 1321    Clinical Impression Statement Decrease in concordant pain with treatment today. Focused on activation of deep neck flexors to decrease resting cervical extension. will re-evaluate next visit.    PT Treatment/Interventions ADLs/Self Care Home Management;Cryotherapy;Electrical Stimulation;Iontophoresis 4mg /ml Dexamethasone;Functional mobility training;Ultrasound;Traction;Moist Heat;Therapeutic activities;Therapeutic exercise;Neuromuscular re-education;Patient/family education;Passive range of motion;Manual techniques;Dry needling;Taping   PT Next Visit Plan re-eval, lifting   PT Home Exercise Plan scapular retraction, bil UE ER, upper trap & levator stretch, cervical rotation SNAG; single arm ER red tband; wall slide GHJ flx, supine horiz abd, supine flx with horiz abd pull on band, prone cervical retraction with lift   Consulted and Agree with Plan of Care Patient      Patient will benefit from skilled therapeutic intervention in order to improve the following deficits and impairments:  Decreased range of motion, Impaired UE functional use, Increased muscle spasms, Decreased activity tolerance, Pain, Impaired flexibility, Improper body mechanics, Postural dysfunction, Impaired sensation, Decreased strength  Visit Diagnosis: Cervicalgia  Acute pain of right shoulder  Acute pain of left shoulder  Muscle weakness (generalized)     Problem List There are no active problems to display for this patient. Chaz Ronning C. Boone Gear PT, DPT 12/07/16 1:25 PM   [facility] [address] [place], [place], [address] Phone:  [phone]   Fax:  [phone]  Name: Bilan Tedesco MRN: [id number] Date of Birth: 07/21/1975

## 2016-12-12 ENCOUNTER — Ambulatory Visit: Payer: PRIVATE HEALTH INSURANCE | Attending: Family Medicine | Admitting: Physical Therapy

## 2016-12-12 ENCOUNTER — Encounter: Payer: Self-pay | Admitting: Physical Therapy

## 2016-12-12 DIAGNOSIS — M6281 Muscle weakness (generalized): Secondary | ICD-10-CM | POA: Diagnosis present

## 2016-12-12 DIAGNOSIS — M25512 Pain in left shoulder: Secondary | ICD-10-CM | POA: Diagnosis present

## 2016-12-12 DIAGNOSIS — M542 Cervicalgia: Secondary | ICD-10-CM | POA: Diagnosis present

## 2016-12-12 DIAGNOSIS — M25511 Pain in right shoulder: Secondary | ICD-10-CM | POA: Diagnosis present

## 2016-12-12 NOTE — Therapy (Signed)
[employer] [place], [place], [address] Phone: [phone]   Fax:  [phone]  Physical Therapy Treatment/Discharge Summary  Patient Details  Name: Cindy Andrade MRN: [id number] Date of Birth: 22-May-1975 Referring Provider: Odis Luster, MD  Encounter Date: 12/12/2016      PT End of Session - 12/12/16 1155    Visit Number 6   Number of Visits 6   Date for PT Re-Evaluation 12/21/16   Authorization Type Workers comp- [facility])   phone [phone], [phone]  Claim #[id number]   Authorization Time Period 6 visits   PT Start Time 1155   PT Stop Time 1230   PT Time Calculation (min) 35 min   Activity Tolerance Patient tolerated treatment well   Behavior During Therapy [facility] for tasks assessed/performed      Past Medical History:  Diagnosis Date  . Anxiety   . Asthma   . Depression     Past Surgical History:  Procedure Laterality Date  . ANTERIOR CRUCIATE LIGAMENT REPAIR    . CHOLECYSTECTOMY    . Salpingectomy      There were no vitals filed for this visit.      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 R cervical paraspinals were painful and was generally sore. Feels like she is back to her baseline as prior to fall. Last few nights, first 3 digits bilat hands numbness, also with driving.    Patient Stated Goals turning R/L, working, household chores, reduce HA pain            [facility] PT Assessment - 12/12/16 0001      AROM   Cervical - Right Rotation 55   Cervical - Left Rotation 57                     [facility] Adult PT Treatment/Exercise - 12/12/16 0001      Manual Therapy   Joint Mobilization prone thoracic & rib mobilizations.    Soft tissue mobilization bilateral cervical paraspinals                PT Education - 12/12/16 1248    Education provided Yes   Education Details  goals discussion, importance of postural alignment- car, work, ADLs, etc.    Person(s) Educated Patient   Methods Explanation   Comprehension Verbalized understanding             PT Long Term Goals - 12/12/16 1202      PT LONG TERM GOAL #1   Title Bilateral cervical rotation to at least 50 deg for functional rotation    Baseline R 55 L57   Status Achieved     PT LONG TERM GOAL #2   Title Pt will be able to reach overhead for high cabinets without increase in neck/shoulder pain   Baseline able without pain   Status Achieved     PT LONG TERM GOAL #3   Title Pt will be able to complete all work related activities without limitation by neck/shoulder pain   Baseline still hurting by the end of the day, which was baseline before fall.   Status Achieved     PT LONG TERM GOAL #4   Title Pt will report resolution of HA pain caused by myofascial pain referred from neck/shoulders   Baseline has not had HA pain since last visit   Status Achieved  PT LONG TERM GOAL #5   Title GHJ ER strength to 5/5 to indicate appropriate strength for periscapular support   Baseline gross 5/5, R GHJ ER 4+/5   Status Partially Met               Plan - 12/12/16 1245    Clinical Impression Statement Pt has met her goals at this time and is d/c to independent program. Encouraged pt to continue performing exercises and focusing on postural alignment. Pt verbalized comfort and understanding and was instructed to contact us with any further questions.    PT Treatment/Interventions ADLs/Self Care Home Management;Cryotherapy;Electrical Stimulation;Iontophoresis 54m/ml Dexamethasone;Functional mobility training;Ultrasound;Traction;Moist Heat;Therapeutic activities;Therapeutic exercise;Neuromuscular re-education;Patient/family education;Passive range of motion;Manual techniques;Dry needling;Taping   PT Home Exercise Plan scapular retraction, bil UE ER, upper trap & levator stretch, cervical rotation  SNAG; single arm ER red tband; wall slide GHJ flx, supine horiz abd, supine flx with horiz abd pull on band, prone cervical retraction with lift   Consulted and Agree with Plan of Care Patient      Patient will benefit from skilled therapeutic intervention in order to improve the following deficits and impairments:  Decreased range of motion, Impaired UE functional use, Increased muscle spasms, Decreased activity tolerance, Pain, Impaired flexibility, Improper body mechanics, Postural dysfunction, Impaired sensation, Decreased strength  Visit Diagnosis: Cervicalgia  Acute pain of right shoulder  Acute pain of left shoulder  Muscle weakness (generalized)     Problem List There are no active problems to display for this patient.   PHYSICAL THERAPY DISCHARGE SUMMARY  Visits from Start of Care: 6  Current functional level related to goals / functional outcomes: See above   Remaining deficits: See above   Education / Equipment: Anatomy of condition, POC, HEP, exercise form/rationale  Plan: Patient agrees to discharge.  Patient goals were met. Patient is being discharged due to meeting the stated rehab goals.  ?????     Cindy Andrade C. Cindy Andrade PT, DPT 12/12/16 12:49 PM   [facility]GH[place] [address]Phone: [phone]  Fax:  [phone] Name: BDeatrice SpanbauerMRN: [id number] of Birth: [birth year]

## 2016-12-14 ENCOUNTER — Encounter: Payer: Self-pay | Admitting: Physical Therapy

## 2016-12-15 DIAGNOSIS — F411 Generalized anxiety disorder: Secondary | ICD-10-CM | POA: Diagnosis not present

## 2016-12-20 DIAGNOSIS — F4323 Adjustment disorder with mixed anxiety and depressed mood: Secondary | ICD-10-CM | POA: Diagnosis not present

## 2016-12-21 DIAGNOSIS — S060X1A Concussion with loss of consciousness of 30 minutes or less, initial encounter: Secondary | ICD-10-CM | POA: Diagnosis not present

## 2016-12-21 DIAGNOSIS — M5012 Mid-cervical disc disorder, unspecified level: Secondary | ICD-10-CM | POA: Diagnosis not present

## 2016-12-21 DIAGNOSIS — M5412 Radiculopathy, cervical region: Secondary | ICD-10-CM | POA: Diagnosis not present

## 2016-12-21 DIAGNOSIS — S134XXA Sprain of ligaments of cervical spine, initial encounter: Secondary | ICD-10-CM | POA: Diagnosis not present

## 2016-12-29 DIAGNOSIS — F411 Generalized anxiety disorder: Secondary | ICD-10-CM | POA: Diagnosis not present

## 2017-01-05 DIAGNOSIS — M5412 Radiculopathy, cervical region: Secondary | ICD-10-CM | POA: Diagnosis not present

## 2017-01-05 DIAGNOSIS — S060X1A Concussion with loss of consciousness of 30 minutes or less, initial encounter: Secondary | ICD-10-CM | POA: Diagnosis not present

## 2017-01-05 DIAGNOSIS — S134XXA Sprain of ligaments of cervical spine, initial encounter: Secondary | ICD-10-CM | POA: Diagnosis not present

## 2017-01-05 DIAGNOSIS — M5012 Mid-cervical disc disorder, unspecified level: Secondary | ICD-10-CM | POA: Diagnosis not present

## 2017-01-12 DIAGNOSIS — F411 Generalized anxiety disorder: Secondary | ICD-10-CM | POA: Diagnosis not present

## 2017-01-13 DIAGNOSIS — E041 Nontoxic single thyroid nodule: Secondary | ICD-10-CM | POA: Diagnosis not present

## 2017-01-25 DIAGNOSIS — M5012 Mid-cervical disc disorder, unspecified level: Secondary | ICD-10-CM | POA: Diagnosis not present

## 2017-01-25 DIAGNOSIS — M5412 Radiculopathy, cervical region: Secondary | ICD-10-CM | POA: Diagnosis not present

## 2017-01-25 DIAGNOSIS — S060X1A Concussion with loss of consciousness of 30 minutes or less, initial encounter: Secondary | ICD-10-CM | POA: Diagnosis not present

## 2017-01-25 DIAGNOSIS — S134XXA Sprain of ligaments of cervical spine, initial encounter: Secondary | ICD-10-CM | POA: Diagnosis not present

## 2017-02-02 DIAGNOSIS — F411 Generalized anxiety disorder: Secondary | ICD-10-CM | POA: Diagnosis not present

## 2017-02-10 DIAGNOSIS — M5412 Radiculopathy, cervical region: Secondary | ICD-10-CM | POA: Diagnosis not present

## 2017-02-10 DIAGNOSIS — S134XXA Sprain of ligaments of cervical spine, initial encounter: Secondary | ICD-10-CM | POA: Diagnosis not present

## 2017-02-10 DIAGNOSIS — M5012 Mid-cervical disc disorder, unspecified level: Secondary | ICD-10-CM | POA: Diagnosis not present

## 2017-02-10 DIAGNOSIS — S060X1A Concussion with loss of consciousness of 30 minutes or less, initial encounter: Secondary | ICD-10-CM | POA: Diagnosis not present

## 2017-02-16 DIAGNOSIS — F411 Generalized anxiety disorder: Secondary | ICD-10-CM | POA: Diagnosis not present

## 2017-02-23 DIAGNOSIS — S060X1A Concussion with loss of consciousness of 30 minutes or less, initial encounter: Secondary | ICD-10-CM | POA: Diagnosis not present

## 2017-02-23 DIAGNOSIS — M5012 Mid-cervical disc disorder, unspecified level: Secondary | ICD-10-CM | POA: Diagnosis not present

## 2017-02-23 DIAGNOSIS — S134XXA Sprain of ligaments of cervical spine, initial encounter: Secondary | ICD-10-CM | POA: Diagnosis not present

## 2017-02-23 DIAGNOSIS — M5412 Radiculopathy, cervical region: Secondary | ICD-10-CM | POA: Diagnosis not present

## 2017-03-02 DIAGNOSIS — F411 Generalized anxiety disorder: Secondary | ICD-10-CM | POA: Diagnosis not present

## 2017-03-06 DIAGNOSIS — F4323 Adjustment disorder with mixed anxiety and depressed mood: Secondary | ICD-10-CM | POA: Diagnosis not present

## 2017-03-10 DIAGNOSIS — M5012 Mid-cervical disc disorder, unspecified level: Secondary | ICD-10-CM | POA: Diagnosis not present

## 2017-03-10 DIAGNOSIS — S134XXA Sprain of ligaments of cervical spine, initial encounter: Secondary | ICD-10-CM | POA: Diagnosis not present

## 2017-03-10 DIAGNOSIS — M5412 Radiculopathy, cervical region: Secondary | ICD-10-CM | POA: Diagnosis not present

## 2017-03-10 DIAGNOSIS — S060X1A Concussion with loss of consciousness of 30 minutes or less, initial encounter: Secondary | ICD-10-CM | POA: Diagnosis not present

## 2017-03-15 DIAGNOSIS — S060X1A Concussion with loss of consciousness of 30 minutes or less, initial encounter: Secondary | ICD-10-CM | POA: Diagnosis not present

## 2017-03-15 DIAGNOSIS — M5012 Mid-cervical disc disorder, unspecified level: Secondary | ICD-10-CM | POA: Diagnosis not present

## 2017-03-15 DIAGNOSIS — M5412 Radiculopathy, cervical region: Secondary | ICD-10-CM | POA: Diagnosis not present

## 2017-03-15 DIAGNOSIS — S134XXA Sprain of ligaments of cervical spine, initial encounter: Secondary | ICD-10-CM | POA: Diagnosis not present

## 2017-03-16 DIAGNOSIS — F411 Generalized anxiety disorder: Secondary | ICD-10-CM | POA: Diagnosis not present

## 2017-03-29 DIAGNOSIS — S060X1A Concussion with loss of consciousness of 30 minutes or less, initial encounter: Secondary | ICD-10-CM | POA: Diagnosis not present

## 2017-03-29 DIAGNOSIS — M5012 Mid-cervical disc disorder, unspecified level: Secondary | ICD-10-CM | POA: Diagnosis not present

## 2017-03-29 DIAGNOSIS — S134XXA Sprain of ligaments of cervical spine, initial encounter: Secondary | ICD-10-CM | POA: Diagnosis not present

## 2017-03-29 DIAGNOSIS — M5412 Radiculopathy, cervical region: Secondary | ICD-10-CM | POA: Diagnosis not present

## 2017-03-30 DIAGNOSIS — F411 Generalized anxiety disorder: Secondary | ICD-10-CM | POA: Diagnosis not present

## 2017-03-31 MED FILL — VIIBRYD 40 MG TABLET: 40 | 90 days supply | Qty: 90 | Fill #1

## 2017-04-03 DIAGNOSIS — F4323 Adjustment disorder with mixed anxiety and depressed mood: Secondary | ICD-10-CM | POA: Diagnosis not present

## 2017-04-06 DIAGNOSIS — S134XXA Sprain of ligaments of cervical spine, initial encounter: Secondary | ICD-10-CM | POA: Diagnosis not present

## 2017-04-06 DIAGNOSIS — M5412 Radiculopathy, cervical region: Secondary | ICD-10-CM | POA: Diagnosis not present

## 2017-04-06 DIAGNOSIS — S060X1A Concussion with loss of consciousness of 30 minutes or less, initial encounter: Secondary | ICD-10-CM | POA: Diagnosis not present

## 2017-04-06 DIAGNOSIS — M5012 Mid-cervical disc disorder, unspecified level: Secondary | ICD-10-CM | POA: Diagnosis not present

## 2017-04-12 DIAGNOSIS — S060X1A Concussion with loss of consciousness of 30 minutes or less, initial encounter: Secondary | ICD-10-CM | POA: Diagnosis not present

## 2017-04-12 DIAGNOSIS — M5412 Radiculopathy, cervical region: Secondary | ICD-10-CM | POA: Diagnosis not present

## 2017-04-12 DIAGNOSIS — M5012 Mid-cervical disc disorder, unspecified level: Secondary | ICD-10-CM | POA: Diagnosis not present

## 2017-04-12 DIAGNOSIS — S134XXA Sprain of ligaments of cervical spine, initial encounter: Secondary | ICD-10-CM | POA: Diagnosis not present

## 2017-04-17 DIAGNOSIS — F4323 Adjustment disorder with mixed anxiety and depressed mood: Secondary | ICD-10-CM | POA: Diagnosis not present

## 2017-04-26 DIAGNOSIS — S134XXA Sprain of ligaments of cervical spine, initial encounter: Secondary | ICD-10-CM | POA: Diagnosis not present

## 2017-04-26 DIAGNOSIS — M5012 Mid-cervical disc disorder, unspecified level: Secondary | ICD-10-CM | POA: Diagnosis not present

## 2017-04-26 DIAGNOSIS — S060X1A Concussion with loss of consciousness of 30 minutes or less, initial encounter: Secondary | ICD-10-CM | POA: Diagnosis not present

## 2017-04-26 DIAGNOSIS — M5412 Radiculopathy, cervical region: Secondary | ICD-10-CM | POA: Diagnosis not present

## 2017-04-27 DIAGNOSIS — F411 Generalized anxiety disorder: Secondary | ICD-10-CM | POA: Diagnosis not present

## 2017-05-01 ENCOUNTER — Encounter: Payer: Self-pay | Admitting: Family Medicine

## 2017-05-01 ENCOUNTER — Ambulatory Visit (INDEPENDENT_AMBULATORY_CARE_PROVIDER_SITE_OTHER): Payer: 59 | Admitting: Family Medicine

## 2017-05-01 VITALS — BP 120/78 | HR 96 | Temp 98.0°F | Wt 247.8 lb

## 2017-05-01 DIAGNOSIS — K582 Mixed irritable bowel syndrome: Secondary | ICD-10-CM

## 2017-05-01 DIAGNOSIS — F341 Dysthymic disorder: Secondary | ICD-10-CM

## 2017-05-01 DIAGNOSIS — L820 Inflamed seborrheic keratosis: Secondary | ICD-10-CM | POA: Diagnosis not present

## 2017-05-01 DIAGNOSIS — E041 Nontoxic single thyroid nodule: Secondary | ICD-10-CM

## 2017-05-01 DIAGNOSIS — L989 Disorder of the skin and subcutaneous tissue, unspecified: Secondary | ICD-10-CM | POA: Diagnosis not present

## 2017-05-01 DIAGNOSIS — G4733 Obstructive sleep apnea (adult) (pediatric): Secondary | ICD-10-CM

## 2017-05-01 DIAGNOSIS — F4323 Adjustment disorder with mixed anxiety and depressed mood: Secondary | ICD-10-CM | POA: Diagnosis not present

## 2017-05-01 DIAGNOSIS — Z13220 Encounter for screening for lipoid disorders: Secondary | ICD-10-CM | POA: Diagnosis not present

## 2017-05-01 LAB — CBC WITH DIFFERENTIAL/PLATELET
Basophils Absolute: 0 10*3/uL (ref 0.0–0.1)
Basophils Relative: 0.7 % (ref 0.0–3.0)
Eosinophils Absolute: 0.2 10*3/uL (ref 0.0–0.7)
Eosinophils Relative: 2.6 % (ref 0.0–5.0)
HCT: 39.3 % (ref 36.0–46.0)
Hemoglobin: 13.3 g/dL (ref 12.0–15.0)
Lymphocytes Relative: 35.9 % (ref 12.0–46.0)
Lymphs Abs: 2.2 10*3/uL (ref 0.7–4.0)
MCHC: 33.7 g/dL (ref 30.0–36.0)
MCV: 83.5 fl (ref 78.0–100.0)
Monocytes Absolute: 0.3 10*3/uL (ref 0.1–1.0)
Monocytes Relative: 5.7 % (ref 3.0–12.0)
Neutro Abs: 3.4 10*3/uL (ref 1.4–7.7)
Neutrophils Relative %: 55.1 % (ref 43.0–77.0)
Platelets: 307 10*3/uL (ref 150.0–400.0)
RBC: 4.71 Mil/uL (ref 3.87–5.11)
RDW: 14 % (ref 11.5–15.5)
WBC: 6.1 10*3/uL (ref 4.0–10.5)

## 2017-05-01 LAB — LIPID PANEL
Cholesterol: 135 mg/dL (ref 0–200)
HDL: 71.2 mg/dL (ref >39.00)
LDL Cholesterol: 49 mg/dL (ref 0–99)
NonHDL: 63.49
Total CHOL/HDL Ratio: 2
Triglycerides: 73 mg/dL (ref 0.0–149.0)
VLDL: 14.6 mg/dL (ref 0.0–40.0)

## 2017-05-01 LAB — COMPREHENSIVE METABOLIC PANEL
ALT: 12 U/L (ref 0–35)
AST: 15 U/L (ref 0–37)
Albumin: 3.8 g/dL (ref 3.5–5.2)
Alkaline Phosphatase: 55 U/L (ref 39–117)
BUN: 11 mg/dL (ref 6–23)
CO2: 28 mEq/L (ref 19–32)
Calcium: 9.5 mg/dL (ref 8.4–10.5)
Chloride: 104 mEq/L (ref 96–112)
Creatinine, Ser: 0.68 mg/dL (ref 0.40–1.20)
GFR: 101.13 mL/min (ref >60.00)
Glucose, Bld: 97 mg/dL (ref 70–99)
Potassium: 3.8 mEq/L (ref 3.5–5.1)
Sodium: 140 mEq/L (ref 135–145)
Total Bilirubin: 0.6 mg/dL (ref 0.2–1.2)
Total Protein: 7.4 g/dL (ref 6.0–8.3)

## 2017-05-01 LAB — TSH: TSH: 1.72 u[IU]/mL (ref 0.35–4.50)

## 2017-05-01 LAB — HEMOGLOBIN A1C: Hgb A1c MFr Bld: 5.6 % (ref 4.6–6.5)

## 2017-05-01 MED ORDER — VILAZODONE HCL 40 MG PO TABS: ORAL_TABLET | ORAL | 3 refills | Status: DC

## 2017-05-01 NOTE — Progress Notes (Signed)
Cindy Andrade is a 42 y.o. female is here to [facility].   Patient Care Team: Briscoe Deutscher, DO as PCP - General (Family Medicine)   History of Present Illness:   HPI: See Assessment and Plan section for Problem Based Charting of issues discussed today.  1. Dysthymia.  Controlled on Viibryd.  Previously followed by psychiatry and has tried many medications.  She would like to continue this medication and have me take over possible.  She does prefer 90-day supply.  No suicidal thoughts.   2. OSA (obstructive sleep apnea).  She was diagnosed about 5-6 years ago and prescribed a CPAP.  Unfortunately, she did not feel secure enough and her current marriage to use the CPAP.  She was counseled multiple times over the last several years to do this.  She feels ready as she is encountering severe daytime somnolence.  She has a CPAP at home that was given to her and is fairly new.  She wants to make sure that the settings are correct.  She will request her sleep study records from the previous group and we will see if we can order supplies.  If not, we will need to order any test.   3. Skin lesion of face.  Left jawline.  Flesh-colored.  Has become irritated and somewhat itchy.  She did accidentally scratch it today.  Minimal bleeding.  No history of skin cancer.   4. Morbid obesity ([place]).  With history of insulin resistance.  Patient is undergoing the [facility] prediabetes program.  She is working with a dietitian and will be starting exercise once per week for the next month.   5. Thyroid nodule.  This is been followed by endocrinology.  She states that she recently saw endocrinology and was cleared for the year.  She would like to transfer care to [facility].   6. Irritable bowel syndrome with both constipation and diarrhea.  Affected by diet.  She uses Bentyl as needed but prefers not to take it since it causes sedation.   Health Maintenance Due  Topic Date Due  . HIV Screening  11/26/1990  .  TETANUS/TDAP  11/26/1994  . PAP SMEAR  11/25/1996   Depression screen PHQ 2/9 05/01/2017  Decreased Interest 0  Down, Depressed, Hopeless 0  PHQ - 2 Score 0  Altered sleeping 1  Tired, decreased energy 3  Change in appetite 1  Feeling bad or failure about yourself  0  Trouble concentrating 0  Moving slowly or fidgety/restless 0  Suicidal thoughts 0  PHQ-9 Score 5  Difficult doing work/chores Somewhat difficult     PMHx, SurgHx, SocialHx, Medications, and Allergies were reviewed in the Visit Navigator and updated as appropriate.   Past Medical History:  Diagnosis Date  . Anxiety   . Asthma   . Depression   . Irritable bowel syndrome with both constipation and diarrhea 02/02/2014  . Obstructive sleep apnea syndrome 02/02/2014   Past Surgical History:  Procedure Laterality Date  . ANTERIOR CRUCIATE LIGAMENT REPAIR    . CHOLECYSTECTOMY    . SALPINGECTOMY     No family history on file. Social History   Tobacco Use  . Smoking status: Never Smoker  . Smokeless tobacco: Never Used  Substance Use Topics  . Alcohol use: Yes    Comment: Occasionally  . Drug use: Yes   Current Medications and Allergies:   .  Vilazodone HCl (VIIBRYD) 40 MG TABS, TAKE 1 TABLET(40 MG) BY MOUTH DAILY, Disp: 90 tablet, Rfl:  3 .  dicyclomine (BENTYL) 20 MG tablet, Take by mouth., Disp: , Rfl:   Allergies  Allergen Reactions  . Other     Dermabond  . Neomycin Rash   Review of Systems:   Pertinent items are noted in the HPI. Otherwise, ROS is negative.  Vitals:   Vitals:   05/01/17 0741  BP: 120/78  Pulse: 96  Temp: 98 F (36.7 C)  TempSrc: Oral  SpO2: 94%  Weight: 247 lb 12.8 oz (112.4 kg)     Body mass index is 43.9 kg/m.  Physical Exam:   Physical Exam  Constitutional: She is oriented to person, place, and time. She appears well-developed and well-nourished. No distress.  HENT:  Head: Normocephalic and atraumatic.  Right Ear: External ear normal.  Left Ear: External ear  normal.  Nose: Nose normal.  Mouth/Throat: Oropharynx is clear and moist.  Eyes: Conjunctivae and EOM are normal. Pupils are equal, round, and reactive to light.  Neck: Normal range of motion. Neck supple. No thyromegaly present.  Cardiovascular: Normal rate, regular rhythm, normal heart sounds and intact distal pulses.  Pulmonary/Chest: Effort normal and breath sounds normal.  Abdominal: Soft. Bowel sounds are normal.  Musculoskeletal: Normal range of motion.  Lymphadenopathy:    She has no cervical adenopathy.  Neurological: She is alert and oriented to person, place, and time.  Skin: Skin is warm and dry. Capillary refill takes less than 2 seconds.  4 mm flesh-colored lesion on left jaw line. Irritated.   Psychiatric: She has a normal mood and affect. Her behavior is normal.  Nursing note and vitals reviewed.  Assessment and Plan:   Current Problem List updated with notes today.   Patient Active Problem List   Diagnosis Date Noted  . Skin lesion of face 05/01/2017  . Status post bilateral salpingectomy 08/17/2015  . Asthma 07/31/2015  . Uterine leiomyoma 07/15/2015  . Anxiety 05/08/2015  . IDA (iron deficiency anemia) 05/08/2015  . Cervical disc disease 02/13/2014  . Thyroid nodule 02/13/2014  . Obstructive sleep apnea syndrome 02/02/2014  . GERD (gastroesophageal reflux disease) 02/02/2014  . Irritable bowel syndrome with both constipation and diarrhea 02/02/2014  . MDD (recurrent major depressive disorder) in remission ([place]) 02/02/2014  . Morbid obesity ([place]) 02/02/2014  . Panic disorder 02/02/2014   Reda was seen today for establish care.  Diagnoses and all orders for this visit:  Dysthymia Comments: Stable on current treatment.  We will go ahead and refill. Orders: -     Vilazodone HCl (VIIBRYD) 40 MG TABS; TAKE 1 TABLET(40 MG) BY MOUTH DAILY  OSA (obstructive sleep apnea) Comments: We will see if the patient can obtain previous results.  Skin lesion of  face Comments: Biopsy done today. Procedure Note:   Procedure:  Skin biopsy Indication:  Suspicious lesion(s)  Risks including unsuccessful procedure, bleeding, infection, bruising, scar, a need for another complete procedure and others were explained to the patient in detail as well as the benefits. Informed consent was obtained and signed.   The patient was placed in a decubitus position.  Lesion #1 on left jaw measuring 4 mm  Skin over lesion #1  was prepped with Betadine and alcohol  and anesthetized with 1 cc of 2% lidocaine and epinephrine, using a 25-gauge 1 inch needle.  Shave biopsy with a sterile Dermablade was carried out in the usual fashion.  Drysol was used for hemostasis. Band-Aid was applied with antibiotic ointment.  Orders: -     Dermatology pathology  Morbid  obesity ([place]) Comments: We reviewed healthy food choices and regular exercise.  She is already working on this. Orders: -     CBC with Differential/Platelet -     Comprehensive metabolic panel -     Hemoglobin A1c  Thyroid nodule Comments: No concerns.  We will go ahead and refer to local endocrinology though she will not need to be seen for 1 year. Orders: -     Ambulatory referral to Endocrinology -     TSH  Irritable bowel syndrome with both constipation and diarrhea Comments: Well-controlled with diet.  Screening for lipid disorders Comments: Labs done today in anticipation of future physical. Orders: -     Lipid panel  Orders Placed This Encounter  Procedures  . CBC with Differential/Platelet  . Comprehensive metabolic panel  . Hemoglobin A1c  . Lipid panel  . TSH  . Ambulatory referral to Endocrinology   Meds ordered this encounter  Medications  . Vilazodone HCl (VIIBRYD) 40 MG TABS    Sig: TAKE 1 TABLET(40 MG) BY MOUTH DAILY    Dispense:  90 tablet    Refill:  3    . Reviewed expectations re: course of current medical issues. . Discussed self-management of  symptoms. . Outlined signs and symptoms indicating need for more acute intervention. . Patient verbalized understanding and all questions were answered. Marland Kitchen Health Maintenance issues including appropriate healthy diet, exercise, and smoking avoidance were discussed with patient. . See orders for this visit as documented in the electronic medical record. . Patient received an After Visit Summary.  CMA served as Education administrator during this visit. History, Physical, and Plan performed by medical provider. The above documentation has been reviewed and is accurate and complete. Briscoe Deutscher, D.O.  Briscoe Deutscher, DO [place], [facility] 05/01/2017

## 2017-05-08 ENCOUNTER — Ambulatory Visit: Payer: Self-pay

## 2017-05-08 NOTE — Telephone Encounter (Signed)
See note as soon as possible.

## 2017-05-08 NOTE — Telephone Encounter (Signed)
Patient is being scheduled for 05/09/17 on [employer] schedule. She will be able to collaborate with Dr. Juleen China as needed

## 2017-05-08 NOTE — Telephone Encounter (Signed)
Patient called and says "I had a lesion removed in the office on 05/01/17 by Dr. Sharlet Salina. I kept it covered and applied the bacitracin as ordered. I noticed some itching, but thought this was the healing process. On Thursday I left it open to air and the incision had a scab with some red bumps. Today I noticed more redness, more itching and the scab looks like it has pus underneath, but no draining or pain. I am allergic to the bacitracin and I didn't know if this is an allergic reaction or what." I asked about fever, chills, she says "no."  I advised this would be sent to Dr. Juleen China and someone will call with her recommendation, patient verbalized understanding.  Reason for Disposition . [1] Caller has NON-URGENT question AND [2] triager unable to answer question  Answer Assessment - Initial Assessment Questions 1. SYMPTOM: "What's the main symptom you're concerned about?" (e.g., redness, pain, drainage)     Redness, itching, maybe pus underneath the scab, possible reaction to bacitracin 2. ONSET: "When did ________  start?"     3/7/8 uncovered, red bumps, itching 3. SURGERY: "What surgery was performed?"     Removal of facial lesion 4. DATE of SURGERY: "When was surgery performed?"      05/01/17 5. INCISION SITE: "Where is the incision located?"     Left side near ear 6. REDNESS: "Is there any redness at the incision site?" If yes, ask: "How wide across is the redness?" (Inches, centimeters)      Yes over the site, quarter size 7. PAIN: "Is there any pain?" If so, ask: "How bad is it?"  (Scale 1-10; or mild, moderate, severe)    No 8. BLEEDING: "Is there any bleeding?" If so, ask: "How much?" and "Where?"     No 9. DRAINAGE: "Is there any drainage from the incision site?" If yes, ask: "What color and how much?" (e.g., red, cloudy, pus; drops, teaspoon)     No 10. FEVER: "Do you have a fever?" If so, ask: "What is your temperature, how was it measured, and when did it start?"      No 11.  OTHER SYMPTOMS: "Do you have any other symptoms?" (e.g., shaking chills, weakness, rash elsewhere on body)       No  Protocols used: POST-OP INCISION [facility]

## 2017-05-09 ENCOUNTER — Ambulatory Visit: Payer: 59 | Admitting: Physician Assistant

## 2017-05-09 ENCOUNTER — Encounter: Payer: Self-pay | Admitting: Physician Assistant

## 2017-05-09 VITALS — BP 118/78 | HR 70 | Temp 98.8°F | Ht 63.0 in | Wt 251.4 lb

## 2017-05-09 DIAGNOSIS — L309 Dermatitis, unspecified: Secondary | ICD-10-CM

## 2017-05-09 DIAGNOSIS — J069 Acute upper respiratory infection, unspecified: Secondary | ICD-10-CM

## 2017-05-09 DIAGNOSIS — Z09 Encounter for follow-up examination after completed treatment for conditions other than malignant neoplasm: Secondary | ICD-10-CM | POA: Diagnosis not present

## 2017-05-09 NOTE — Patient Instructions (Addendum)
It was great to meet you!  Keep vaseline on the area if you feel like it needs to be moistened. I would stop using bacitracin.  It was great to see you!  You have a viral upper respiratory infection. Antibiotics are not needed for this.  Viral infections usually take 7-10 days to resolve.  The cough can last a few weeks to go away.  Push fluids and get plenty of rest. Please return if you are not improving as expected, or if you have high fevers (>101.5) or difficulty swallowing or worsening productive cough.  Call clinic with questions.  I hope you start feeling better soon!

## 2017-05-09 NOTE — Progress Notes (Signed)
Cindy Andrade is a 42 y.o. female is here to discuss: Check biopsy site left jaw area.  I acted as a Education administrator for [employer], PA-C Anselmo Pickler, LPN   History of Present Illness:   Chief Complaint  Patient presents with  . Check Lesion    Biopsy done Left jaw area 05/01/17  . Sinus Problem    Pt here today to check lesion left jaw area biopsy done 05/01/2017.  Pt c/o redness around site left jaw and itching. She denies discharge from the area. It did scab, but after she removed the scab she had a little sensitivity. She has been using bacitracin, which she has used in the past without issue, but thinks that she is now allergic. She has multiple skin allergies including neosporin, dermabond, tape. Denies fevers. Biopsy returned for: seborrheic keratosis.   Sinus Problem  This is a new problem. Episode onset: Pt been having congestion few day and now yesterday started to feel bad. The problem has been gradually worsening since onset. There has been no fever. Her pain is at a severity of 0/10. She is experiencing no pain. Associated symptoms include chills and congestion. Pertinent negatives include no coughing, headaches, sinus pressure or swollen glands. (Pt noticed swolllen lymph node left side below left ear, scratchy throat.) Treatments tried: Motrin for back pain. The treatment provided no relief.     Health Maintenance Due  Topic Date Due  . HIV Screening  11/26/1990  . PAP SMEAR  11/25/1996    Past Medical History:  Diagnosis Date  . Anxiety   . Arthritis   . Asthma   . Chicken pox   . Depression   . Hay fever   . Irritable bowel syndrome with both constipation and diarrhea 02/02/2014  . Obstructive sleep apnea syndrome 02/02/2014     Social History   Socioeconomic History  . Marital status: Married    Spouse name: Not on file  . Number of children: Not on file  . Years of education: Not on file  . Highest education level: Not on file  Social Needs  .  Financial resource strain: Not on file  . Food insecurity - worry: Not on file  . Food insecurity - inability: Not on file  . Transportation needs - medical: Not on file  . Transportation needs - non-medical: Not on file  Occupational History    Employer: [employer]  Tobacco Use  . Smoking status: Never Smoker  . Smokeless tobacco: Never Used  Substance and Sexual Activity  . Alcohol use: Yes    Comment: Occasionally  . Drug use: Yes  . Sexual activity: Not on file  Other Topics Concern  . Not on file  Social History Narrative  . Not on file    Past Surgical History:  Procedure Laterality Date  . ANTERIOR CRUCIATE LIGAMENT REPAIR    . CHOLECYSTECTOMY  2012  . SALPINGECTOMY      Family History  Problem Relation Age of Onset  . Arthritis Mother   . Cancer Mother   . Cancer Father   . Depression Father   . Diabetes Father   . Hearing loss Father   . High Cholesterol Father   . Kidney disease Father   . Stroke Father   . Arthritis Maternal Grandmother   . Cancer Maternal Grandmother   . Depression Maternal Grandmother   . Birth defects Paternal Grandmother   . High Cholesterol Paternal Grandmother   . Heart attack Paternal Grandfather   .  Heart disease Paternal Grandfather   . High Cholesterol Paternal Grandfather   . Hypertension Paternal Grandfather     PMHx, SurgHx, SocialHx, FamHx, Medications, and Allergies were reviewed in the Visit Navigator and updated as appropriate.   Patient Active Problem List   Diagnosis Date Noted  . Skin lesion of face 05/01/2017  . Status post bilateral salpingectomy 08/17/2015  . Asthma 07/31/2015  . Uterine leiomyoma 07/15/2015  . Anxiety 05/08/2015  . IDA (iron deficiency anemia) 05/08/2015  . Cervical disc disease 02/13/2014  . Thyroid nodule 02/13/2014  . Obstructive sleep apnea syndrome 02/02/2014  . GERD (gastroesophageal reflux disease) 02/02/2014  . Irritable bowel syndrome with both constipation and diarrhea  02/02/2014  . MDD (recurrent major depressive disorder) in remission ([place]) 02/02/2014  . Morbid obesity ([place]) 02/02/2014  . Panic disorder 02/02/2014    Social History   Tobacco Use  . Smoking status: Never Smoker  . Smokeless tobacco: Never Used  Substance Use Topics  . Alcohol use: Yes    Comment: Occasionally  . Drug use: Yes    Current Medications and Allergies:    Current Outpatient Medications:  .  dicyclomine (BENTYL) 20 MG tablet, Take 20 mg by mouth as needed. , Disp: , Rfl:  .  Vilazodone HCl (VIIBRYD) 40 MG TABS, TAKE 1 TABLET(40 MG) BY MOUTH DAILY, Disp: 90 tablet, Rfl: 3   Allergies  Allergen Reactions  . Bacitracin Rash  . Neosporin  [Neomycin-Bacitracin Zn-Polymyx] Rash  . Other     Dermabond  . Neomycin Rash  . Tape Rash    Dermabond    Review of Systems   Review of Systems  Constitutional: Positive for chills.  HENT: Positive for congestion. Negative for sinus pressure.   Respiratory: Negative for cough.   Neurological: Negative for headaches.    Vitals:   Vitals:   05/09/17 0903  BP: 118/78  Pulse: 70  Temp: 98.8 F (37.1 C)  TempSrc: Oral  SpO2: 98%  Weight: 251 lb 6.1 oz (114 kg)  Height: 5\' 3"  (1.6 m)     Body mass index is 44.53 kg/m.   Physical Exam:    Physical Exam  Constitutional: She appears well-developed. She is cooperative.  Non-toxic appearance. She does not have a sickly appearance. She does not appear ill. No distress.  HENT:  Head: Normocephalic and atraumatic.  Right Ear: Tympanic membrane, external ear and ear canal normal. Tympanic membrane is not erythematous, not retracted and not bulging.  Left Ear: Tympanic membrane, external ear and ear canal normal. Tympanic membrane is not erythematous, not retracted and not bulging.  Nose: Mucosal edema and rhinorrhea present. Right sinus exhibits no maxillary sinus tenderness and no frontal sinus tenderness. Left sinus exhibits no maxillary sinus tenderness and no  frontal sinus tenderness.  Mouth/Throat: Uvula is midline and mucous membranes are normal. Posterior oropharyngeal erythema present. No posterior oropharyngeal edema. Tonsils are 0 on the right. Tonsils are 0 on the left. No tonsillar exudate.  Eyes: Conjunctivae and lids are normal.  Neck: Trachea normal.  Cardiovascular: Normal rate, regular rhythm, S1 normal, S2 normal, normal heart sounds and normal pulses.  No LE edema  Pulmonary/Chest: Effort normal and breath sounds normal. She has no decreased breath sounds. She has no wheezes. She has no rhonchi. She has no rales.  Lymphadenopathy:       Head (left side): Submandibular adenopathy present.    She has no cervical adenopathy.  Neurological: She is alert. GCS eye subscore is 4. GCS  verbal subscore is 5. GCS motor subscore is 6.  Skin: Skin is warm, dry and intact.  Small area of erythema to L lower preauricular area. <66mm area of scattered erythematous papules without evidence of drainage. Well-healed small linear scar.  Psychiatric: She has a normal mood and affect. Her speech is normal and behavior is normal.  Nursing note and vitals reviewed.    Assessment and Plan:    Jennice was seen today for check lesion and sinus problem.  Diagnoses and all orders for this visit:  Viral URI No red flags on exam.  Will initiate supportive care -- rest, fluids, Mucinex. Discussed taking medications as prescribed. Reviewed return precautions including worsening fever, SOB, worsening cough or other concerns. Push fluids and rest. I recommend that patient follow-up if symptoms worsen or persist despite treatment x 7-10 days, sooner if needed.  Status post excision of skin lesion, follow-up exam Area appears to be healing well. Provided reassurance.  Dermatitis I suspect that she had an allergic reaction to bacitracin. Avoid further use. Added to her allergies in Epic. I recommended use of vaseline or aquaphor if needed.  If patient doesn't  respond to continued supportive care, consider initiation of doxycycline to cover for early sinusitis and to cover for any residual skin issues that she may be experiencing.   . Reviewed expectations re: course of current medical issues. . Discussed self-management of symptoms. . Outlined signs and symptoms indicating need for more acute intervention. . Patient verbalized understanding and all questions were answered. . See orders for this visit as documented in the electronic medical record. . Patient received an After Visit Summary.  CMA or LPN served as scribe during this visit. History, Physical, and Plan performed by medical provider. Documentation and orders reviewed and attested to.  Inda Coke, PA-C [place] 05/09/2017  Follow-up: No Follow-up on file.

## 2017-05-11 DIAGNOSIS — F411 Generalized anxiety disorder: Secondary | ICD-10-CM | POA: Diagnosis not present

## 2017-05-15 DIAGNOSIS — F4323 Adjustment disorder with mixed anxiety and depressed mood: Secondary | ICD-10-CM | POA: Diagnosis not present

## 2017-05-17 ENCOUNTER — Encounter: Payer: 59 | Admitting: Family Medicine

## 2017-05-17 DIAGNOSIS — S060X1A Concussion with loss of consciousness of 30 minutes or less, initial encounter: Secondary | ICD-10-CM | POA: Diagnosis not present

## 2017-05-17 DIAGNOSIS — M5012 Mid-cervical disc disorder, unspecified level: Secondary | ICD-10-CM | POA: Diagnosis not present

## 2017-05-17 DIAGNOSIS — M5412 Radiculopathy, cervical region: Secondary | ICD-10-CM | POA: Diagnosis not present

## 2017-05-17 DIAGNOSIS — S134XXA Sprain of ligaments of cervical spine, initial encounter: Secondary | ICD-10-CM | POA: Diagnosis not present

## 2017-05-25 DIAGNOSIS — F411 Generalized anxiety disorder: Secondary | ICD-10-CM | POA: Diagnosis not present

## 2017-05-29 ENCOUNTER — Encounter: Payer: Self-pay | Admitting: Family Medicine

## 2017-06-01 DIAGNOSIS — S060X1A Concussion with loss of consciousness of 30 minutes or less, initial encounter: Secondary | ICD-10-CM | POA: Diagnosis not present

## 2017-06-01 DIAGNOSIS — S134XXA Sprain of ligaments of cervical spine, initial encounter: Secondary | ICD-10-CM | POA: Diagnosis not present

## 2017-06-01 DIAGNOSIS — M5412 Radiculopathy, cervical region: Secondary | ICD-10-CM | POA: Diagnosis not present

## 2017-06-01 DIAGNOSIS — M5012 Mid-cervical disc disorder, unspecified level: Secondary | ICD-10-CM | POA: Diagnosis not present

## 2017-06-02 ENCOUNTER — Encounter: Payer: Self-pay | Admitting: Physical Therapy

## 2017-06-02 ENCOUNTER — Ambulatory Visit (INDEPENDENT_AMBULATORY_CARE_PROVIDER_SITE_OTHER): Payer: 59 | Admitting: Family Medicine

## 2017-06-02 ENCOUNTER — Encounter: Payer: Self-pay | Admitting: Family Medicine

## 2017-06-02 VITALS — BP 124/76 | HR 93 | Temp 98.3°F | Ht 63.0 in | Wt 250.0 lb

## 2017-06-02 DIAGNOSIS — Z0000 Encounter for general adult medical examination without abnormal findings: Secondary | ICD-10-CM | POA: Diagnosis not present

## 2017-06-02 DIAGNOSIS — J3489 Other specified disorders of nose and nasal sinuses: Secondary | ICD-10-CM | POA: Diagnosis not present

## 2017-06-02 MED ORDER — MUPIROCIN 2 % EX OINT: 1.0000 "application " | TOPICAL_OINTMENT | Freq: Two times a day (BID) | CUTANEOUS | 0 refills | Status: DC

## 2017-06-02 MED FILL — MUPIROCIN 2% OINTMENT: 2 | 10 days supply | Qty: 22 | Fill #0

## 2017-06-02 NOTE — Progress Notes (Signed)
Subjective:    Taylore Hinde is a 42 y.o. female and is here for a comprehensive physical exam.  Health Maintenance Due  Topic Date Due  . HIV Screening  11/26/1990  . PAP SMEAR  11/25/1996   PMHx, SurgHx, SocialHx, Medications, and Allergies were reviewed in the Visit Navigator and updated as appropriate.   Past Medical History:  Diagnosis Date  . Anxiety   . Arthritis   . Asthma   . Chicken pox   . Depression   . Hay fever   . Irritable bowel syndrome with both constipation and diarrhea 02/02/2014  . Obstructive sleep apnea syndrome 02/02/2014    Past Surgical History:  Procedure Laterality Date  . ANTERIOR CRUCIATE LIGAMENT REPAIR    . CHOLECYSTECTOMY  2012  . SALPINGECTOMY      Family History  Problem Relation Age of Onset  . Arthritis Mother   . Cancer Mother   . Cancer Father   . Depression Father   . Diabetes Father   . Hearing loss Father   . High Cholesterol Father   . Kidney disease Father   . Stroke Father   . Arthritis Maternal Grandmother   . Cancer Maternal Grandmother   . Depression Maternal Grandmother   . Birth defects Paternal Grandmother   . High Cholesterol Paternal Grandmother   . Heart attack Paternal Grandfather   . Heart disease Paternal Grandfather   . High Cholesterol Paternal Grandfather   . Hypertension Paternal Grandfather    Social History   Tobacco Use  . Smoking status: Never Smoker  . Smokeless tobacco: Never Used  Substance Use Topics  . Alcohol use: Yes    Comment: Occasionally  . Drug use: Yes   Review of Systems:   Pertinent items are noted in the HPI. Otherwise, ROS is negative.  Objective:   BP 124/76   Pulse 93   Temp 98.3 F (36.8 C) (Oral)   Ht 5\' 3"  (1.6 m)   Wt 250 lb (113.4 kg)   LMP 05/02/2017   SpO2 97%   BMI 44.29 kg/m     Wt Readings from Last 3 Encounters:  06/02/17 250 lb (113.4 kg)  02/17/16 253 lb (114.8 kg)  05/09/17 251 lb 6.1 oz (114 kg)     Ht Readings from Last 3  Encounters:  06/02/17 5\' 3"  (1.6 m)  05/09/17 5\' 3"  (1.6 m)  10/11/16 5\' 3"  (1.6 m)   General appearance: alert, cooperative and appears stated age. Head: normocephalic, without obvious abnormality, atraumatic. Neck: no adenopathy, supple, symmetrical, trachea midline; thyroid not enlarged, symmetric, no tenderness/mass/nodules. Lungs: clear to auscultation bilaterally. Heart: regular rate and rhythm Abdomen: soft, non-tender; no masses,  no organomegaly. Extremities: extremities normal, atraumatic, no cyanosis or edema. Skin: skin color, texture, turgor normal, no rashes or lesions. Lymph: cervical, supraclavicular, and axillary nodes normal; no abnormal inguinal nodes palpated. Neurologic: grossly normal.  Results for orders placed or performed in visit on 05/01/17  CBC with Differential/Platelet  Result Value Ref Range   WBC 6.1 4.0 - 10.5 K/uL   RBC 4.71 3.87 - 5.11 Mil/uL   Hemoglobin 13.3 12.0 - 15.0 g/dL   HCT 39.3 36.0 - 46.0 %   MCV 83.5 78.0 - 100.0 fl   MCHC 33.7 30.0 - 36.0 g/dL   RDW 14.0 11.5 - 15.5 %   Platelets 307.0 150.0 - 400.0 K/uL   Neutrophils Relative % 55.1 43.0 - 77.0 %   Lymphocytes Relative 35.9 12.0 - 46.0 %  Monocytes Relative 5.7 3.0 - 12.0 %   Eosinophils Relative 2.6 0.0 - 5.0 %   Basophils Relative 0.7 0.0 - 3.0 %   Neutro Abs 3.4 1.4 - 7.7 K/uL   Lymphs Abs 2.2 0.7 - 4.0 K/uL   Monocytes Absolute 0.3 0.1 - 1.0 K/uL   Eosinophils Absolute 0.2 0.0 - 0.7 K/uL   Basophils Absolute 0.0 0.0 - 0.1 K/uL  Comprehensive metabolic panel  Result Value Ref Range   Sodium 140 135 - 145 mEq/L   Potassium 3.8 3.5 - 5.1 mEq/L   Chloride 104 96 - 112 mEq/L   CO2 28 19 - 32 mEq/L   Glucose, Bld 97 70 - 99 mg/dL   BUN 11 6 - 23 mg/dL   Creatinine, Ser 0.68 0.40 - 1.20 mg/dL   Total Bilirubin 0.6 0.2 - 1.2 mg/dL   Alkaline Phosphatase 55 39 - 117 U/L   AST 15 0 - 37 U/L   ALT 12 0 - 35 U/L   Total Protein 7.4 6.0 - 8.3 g/dL   Albumin 3.8 3.5 - 5.2 g/dL    Calcium 9.5 8.4 - 10.5 mg/dL   GFR 101.13 >60.00 mL/min  Hemoglobin A1c  Result Value Ref Range   Hgb A1c MFr Bld 5.6 4.6 - 6.5 %  Lipid panel  Result Value Ref Range   Cholesterol 135 0 - 200 mg/dL   Triglycerides 73.0 0.0 - 149.0 mg/dL   HDL 71.20 >39.00 mg/dL   VLDL 14.6 0.0 - 40.0 mg/dL   LDL Cholesterol 49 0 - 99 mg/dL   Total CHOL/HDL Ratio 2    NonHDL 63.49   TSH  Result Value Ref Range   TSH 1.72 0.35 - 4.50 uIU/mL   Assessment/Plan:   Marnie was seen today for abdominal pain.  Diagnoses and all orders for this visit:  Routine physical examination  Dry nares Comments: Patient has dry nose that has been bleeding somewhat now has a scab.  Okay mupirocin. Orders: -     mupirocin ointment (BACTROBAN) 2 %; Place 1 application into the nose 2 (two) times daily.   Patient Counseling: [x]    Nutrition: Stressed importance of moderation in sodium/caffeine intake, saturated fat and cholesterol, caloric balance, sufficient intake of fresh fruits, vegetables, fiber, calcium, iron, and 1 mg of folate supplement per day (for females capable of pregnancy).  [x]    Stressed the importance of regular exercise.   [x]    Substance Abuse: Discussed cessation/primary prevention of tobacco, alcohol, or other drug use; driving or other dangerous activities under the influence; availability of treatment for abuse.   [x]    Injury prevention: Discussed safety belts, safety helmets, smoke detector, smoking near bedding or upholstery.   [x]    Sexuality: Discussed sexually transmitted diseases, partner selection, use of condoms, avoidance of unintended pregnancy  and contraceptive alternatives.  [x]    Dental health: Discussed importance of regular tooth brushing, flossing, and dental visits.  [x]    Health maintenance and immunizations reviewed. Please refer to Health maintenance section.   Briscoe Deutscher, DO [place]

## 2017-06-07 DIAGNOSIS — S060X1A Concussion with loss of consciousness of 30 minutes or less, initial encounter: Secondary | ICD-10-CM | POA: Diagnosis not present

## 2017-06-07 DIAGNOSIS — M5012 Mid-cervical disc disorder, unspecified level: Secondary | ICD-10-CM | POA: Diagnosis not present

## 2017-06-07 DIAGNOSIS — M5412 Radiculopathy, cervical region: Secondary | ICD-10-CM | POA: Diagnosis not present

## 2017-06-07 DIAGNOSIS — S134XXA Sprain of ligaments of cervical spine, initial encounter: Secondary | ICD-10-CM | POA: Diagnosis not present

## 2017-06-08 DIAGNOSIS — F411 Generalized anxiety disorder: Secondary | ICD-10-CM | POA: Diagnosis not present

## 2017-06-09 ENCOUNTER — Telehealth: Payer: Self-pay | Admitting: Surgical

## 2017-06-09 DIAGNOSIS — G4733 Obstructive sleep apnea (adult) (pediatric): Secondary | ICD-10-CM

## 2017-06-09 NOTE — Telephone Encounter (Signed)
Left message for patient that per Dr. Juleen China we are going to have to send the patient to [facility] due to sleep study showing she needs a BIPAP machine instead of CPAP machine. I will place referral and fax records.

## 2017-06-09 NOTE — Telephone Encounter (Signed)
Patient called back and I explained that Pulmonology will be taking care of everything with the sleep study.

## 2017-06-09 NOTE — Telephone Encounter (Signed)
Left message for patient to return call to let her know that we have received the sleep study. Need to determine if she is needing CPAP machine or supplies.

## 2017-06-12 DIAGNOSIS — F4323 Adjustment disorder with mixed anxiety and depressed mood: Secondary | ICD-10-CM | POA: Diagnosis not present

## 2017-06-20 ENCOUNTER — Encounter: Payer: Self-pay | Admitting: Family Medicine

## 2017-06-21 ENCOUNTER — Other Ambulatory Visit: Payer: Self-pay

## 2017-06-21 DIAGNOSIS — R0683 Snoring: Secondary | ICD-10-CM

## 2017-06-22 ENCOUNTER — Encounter (INDEPENDENT_AMBULATORY_CARE_PROVIDER_SITE_OTHER): Payer: Self-pay

## 2017-06-22 DIAGNOSIS — F411 Generalized anxiety disorder: Secondary | ICD-10-CM | POA: Diagnosis not present

## 2017-06-28 ENCOUNTER — Encounter: Payer: Self-pay | Admitting: Neurology

## 2017-06-28 ENCOUNTER — Ambulatory Visit: Payer: 59 | Admitting: Neurology

## 2017-06-28 VITALS — BP 127/78 | HR 88 | Ht 63.0 in | Wt 248.0 lb

## 2017-06-28 DIAGNOSIS — G4710 Hypersomnia, unspecified: Secondary | ICD-10-CM | POA: Diagnosis not present

## 2017-06-28 DIAGNOSIS — G4730 Sleep apnea, unspecified: Secondary | ICD-10-CM | POA: Diagnosis not present

## 2017-06-28 DIAGNOSIS — S060X1A Concussion with loss of consciousness of 30 minutes or less, initial encounter: Secondary | ICD-10-CM | POA: Diagnosis not present

## 2017-06-28 DIAGNOSIS — M5012 Mid-cervical disc disorder, unspecified level: Secondary | ICD-10-CM | POA: Diagnosis not present

## 2017-06-28 DIAGNOSIS — G4719 Other hypersomnia: Secondary | ICD-10-CM

## 2017-06-28 DIAGNOSIS — M5412 Radiculopathy, cervical region: Secondary | ICD-10-CM | POA: Diagnosis not present

## 2017-06-28 DIAGNOSIS — S134XXA Sprain of ligaments of cervical spine, initial encounter: Secondary | ICD-10-CM | POA: Diagnosis not present

## 2017-06-28 DIAGNOSIS — G4759 Other parasomnia: Secondary | ICD-10-CM | POA: Insufficient documentation

## 2017-06-28 DIAGNOSIS — G4753 Recurrent isolated sleep paralysis: Secondary | ICD-10-CM | POA: Diagnosis not present

## 2017-06-28 MED ORDER — MODAFINIL 200 MG PO TABS: 200.0000 mg | ORAL_TABLET | Freq: Every day | ORAL | 5 refills | Status: DC

## 2017-06-28 NOTE — Progress Notes (Signed)
SLEEP MEDICINE CLINIC   Provider:  Larey Seat, M D  Primary Care Physician:  Cindy Deutscher, DO   Referring Provider: Briscoe Deutscher, DO    Chief Complaint  Patient presents with  . New Patient (Initial Visit)    pt alone, rm 11. pt was diagnosed with sleep apnea 9 years ago at [facility] she was told she needed a bipap. at that time was not ready for treatment but states that she if fully ready now to do a sleep study and proceed with treatment cause it is impacting her every day life.     HPI:  Cindy Andrade is a 42 y.o. female , seen here as in a referral from Dr. Juleen Andrade for a new evaluation of sleep apnea, witnessed apnea and snoring per husband.  Suffering from hypersomnia. Morbid obesity. Memory issues. Monday left work after supervisor noted her to sleep, fell almost asleep driving.  Is a pleasure of seeing Cindy Andrade today as a new patient on 28 Jun 2017, and she reports how to start and anxious she has become in relation to her hypersomnia.  Her excessive daytime sleepiness affects her work, her social life her driving safety.  She was evaluated for sleep apnea about 9 years ago at [facility] and at the time was diagnosed with obstructive sleep apnea but could not tolerate the treatment.  She was placed on CPAP but at the time her then spouse was not very supportive and she felt that this was  another intrusion into her marital life. At the time her IBS was also affecting her.  In the meantime she is been married, her husband has witnessed her to snore and have apneas.  She sometimes has to call a friend on her cell phone while driving to keep her awake, she has trouble to concentrate at work fatigue and excessive daytime sleepiness have affected her productivity her reliability.  She is quite anxious at this time, she drinks a lot of caffeine in order to keep herself awake, but she has refrained from energy drinks.   Sleep habits are as follows:  She does not have trouble going to sleep, she usually tries to keep her brain active by playing on her phone possible games etc. and this also helps her to get a buffer between her and her revolving thoughts .  She describes her marital bedroom is cool, quiet and dark, she is asleep was less than 10 minutes once she goes to bed about 9.45 PM  She has a bedroom with her husband.  He comes to bed later , by around one hour. She does avoid supine sleep position because she wakes up gasping, however she sometimes still inadvertently able to resume supine sleep.  She has tried to train herself to sleep on her side.  Because of back pain she is not able to sleep prone.  In order to prevent cervicalgia she is using an orthopedic pillow and one between the legs.  Waking up at 5.30 alarm,  She feels she needs several alarms, struggles to get up.  She dreams a lot, often vivid, nightmarish dreams which she interacts with, enacts. Sleep paralysis, dream intrusion. Has sleep- walking episodes. She shakes when she gest angry, but her knees buckle. Naps whenever physically not active and mentally not stimulated. Falling asleep at work.  .     Sleep medical history and family sleep history: father has OSA. He worked as a Pharmacist, community and took naps in  daytime .  Social history: remarried, no children. Salpingectomy. Anxiety disorder. Loss of libido, weight gain.  Dogs, one sleeps in the bedroom.  caffeine overuse, never smoked, asthma- ETOH very seldomly.   Review of Systems: Out of a complete 14 system review, the patient complains of only the following symptoms, and all other reviewed systems are negative.  EDS- excessive sleepiness, irresistible urge to go to sleep, sleep paralysis, naps, sleep attacks. possible cataplexy.   Epworth score 17 out of 24 ! , Fatigue severity score 58/ 63   , depression score deferred.   Social History   Socioeconomic History  . Marital status: Married    Spouse name: Not on file    . Number of children: Not on file  . Years of education: Not on file  . Highest education level: Not on file  Occupational History    Employer: [employer]  . Financial resource strain: Not on file  . Food insecurity:    Worry: Not on file    Inability: Not on file  . Transportation needs:    Medical: Not on file    Non-medical: Not on file  Tobacco Use  . Smoking status: Never Smoker  . Smokeless tobacco: Never Used  Substance and Sexual Activity  . Alcohol use: Yes    Comment: Occasionally  . Drug use: Never  . Sexual activity: Not on file  Lifestyle  . Physical activity:    Days per week: Not on file    Minutes per session: Not on file  . Stress: Not on file  Relationships  . Social connections:    Talks on phone: Not on file    Gets together: Not on file    Attends religious service: Not on file    Active member of club or organization: Not on file    Attends meetings of clubs or organizations: Not on file    Relationship status: Not on file  . Intimate partner violence:    Fear of current or ex partner: Not on file    Emotionally abused: Not on file    Physically abused: Not on file    Forced sexual activity: Not on file  Other Topics Concern  . Not on file  Social History Narrative  . Not on file    Family History  Problem Relation Age of Onset  . Arthritis Mother   . Cancer Mother   . Cancer Father   . Depression Father   . Diabetes Father   . Hearing loss Father   . High Cholesterol Father   . Kidney disease Father   . Stroke Father   . Sleep apnea Father   . Arthritis Maternal Grandmother   . Cancer Maternal Grandmother   . Depression Maternal Grandmother   . Birth defects Paternal Grandmother   . High Cholesterol Paternal Grandmother   . Heart attack Paternal Grandfather   . Heart disease Paternal Grandfather   . High Cholesterol Paternal Grandfather   . Hypertension Paternal Grandfather     Past Medical History:   Diagnosis Date  . Anxiety   . Arthritis   . Asthma   . Chicken pox   . Depression   . Hay fever   . Irritable bowel syndrome with both constipation and diarrhea 02/02/2014  . Obstructive sleep apnea syndrome 02/02/2014    Past Surgical History:  Procedure Laterality Date  . ANTERIOR CRUCIATE LIGAMENT REPAIR    . CHOLECYSTECTOMY  2012  . eye  surgery  Bilateral   . SALPINGECTOMY      Current Outpatient Medications  Medication Sig Dispense Refill  . dicyclomine (BENTYL) 20 MG tablet Take 20 mg by mouth as needed.     Marland Kitchen ibuprofen (ADVIL,MOTRIN) 800 MG tablet Take by mouth.    . loratadine-pseudoephedrine (CLARITIN-D 12-HOUR) 5-120 MG tablet Take 1 tablet by mouth daily.    . mupirocin ointment (BACTROBAN) 2 % Place 1 application into the nose 2 (two) times daily. 22 g 0  . OVER THE COUNTER MEDICATION 1 tablet 2 (two) times daily. Magnesium, calcium citrate and vitamin D combo pill.    . Vilazodone HCl (VIIBRYD) 40 MG TABS TAKE 1 TABLET(40 MG) BY MOUTH DAILY 90 tablet 3   No current facility-administered medications for this visit.     Allergies as of 06/28/2017 - Review Complete 06/28/2017  Allergen Reaction Noted  . Bacitracin Rash 05/09/2017  . Neosporin  [neomycin-bacitracin zn-polymyx] Rash 01/30/2014  . Other  10/11/2016  . Neomycin Rash 04/19/2015  . Tape Rash 08/05/2015    Vitals: BP 127/78   Pulse 88   Ht 5\' 3"  (1.6 m)   Wt 248 lb (112.5 kg)   BMI 43.93 kg/m  Last Weight:  Wt Readings from Last 1 Encounters:  06/28/17 248 lb (112.5 kg)   ZOX:WRUE mass index is 43.93 kg/m.     Last Height:   Ht Readings from Last 1 Encounters:  06/28/17 5\' 3"  (1.6 m)    Physical exam:  General: The patient is awake, alert and appears not in acute distress. The patient is well groomed. Head: Normocephalic, atraumatic. Neck is supple. Mallampati 2-3 ,  nasal congestion, fractured septum . neck circumference: 16.  Retrognathia is seen.  Crowded dental status.   Cardiovascular:  Regular rate and rhythm , without  murmurs or carotid bruit, and without distended neck veins. Respiratory: Lungs are clear to auscultation.Skin:  Without evidence of edema, or rash. Trunk: BMI is 44. The patient's posture is erect.  Neurologic exam : The patient is awake and alert, oriented to place and time.  Memory subjective described as intact.   Attention span & concentration ability appears normal.  Speech is fluent,  without dysarthria, dysphonia or aphasia.  Mood and affect are tearful , depressed, anxious.  Cranial nerves: Pupils are equal and briskly reactive to light. Funduscopic exam without  evidence of pallor or edema. Extraocular movements  in vertical and horizontal planes intact and without nystagmus. Visual fields by finger perimetry are intact. Hearing to finger rub intact.   Facial sensation intact to fine touch.  Facial motor strength is symmetric and tongue and uvula move midline. Shoulder shrug was symmetrical.   Motor exam:   Normal tone, muscle bulk and symmetric strength in all extremities. Good grip strength. Sensory:  Fine touch, pinprick and vibration were tested in all extremities. Proprioception tested in the upper extremities was normal. Coordination: Rapid alternating movements in the fingers/hands was normal. Finger-to-nose maneuver  normal without evidence of ataxia, dysmetria or tremor. Gait and station: Patient walks without assistive device and is able unassisted to climb up to the exam table. Strength within normal limits.  Stance is stable and normal.   Deep tendon reflexes: in the  upper and lower extremities are symmetric and intact. Babinski maneuver deferred.     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   2) OSA observed apnea and snoring, largely related to BMI. Snoring since  childhood. She lost 60 pounds on Paleo in 2015-16 and she resumed 250 pounds soon after.   3) Obesity.  Diabetes prevention program through [facility].    The patient was advised of the nature of the diagnosed disorder , the treatment options and the  risks for general health and wellness arising from not treating the condition.   I spent more than 45 minutes of face to face time with the patient.  Greater than 50% of time was spent in counseling and coordination of care. We have discussed the diagnosis and differential and I answered the patient's questions.    Plan:  Treatment plan and additional workup :   Mrs. Ganoe reports excessive daytime sleepiness with sleep attacks, isolated sleep paralysis, dream intrusion sometimes parasomnia-like leaving the bed during dream activity.  She has also described loss of muscle tone control during emotionally upsetting situations, not a classic cataplexy more trembling and high anxiety level.  She has signs and symptoms of obstructive sleep apnea and her husband has observed apnea and snoring, her risk factor is mainly her body mass index elevation.  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  She felt better and she had lost 60 pounds which also made her overall happier, affected her libido and her depression positively.   She was still snoring as far as she knows and she snored even in shower.  My goal is to invite Mrs. Urquilla for an attended sleep study ASAP to split at an AHI of 20 and to use if possible nasal interface CPAP or nasal mask.   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  She does have some nasal patency issues.  If CPAP  cannot be tolerated and if she does not have oxygen 3 circulation episodes I would recommend a dental device for treatment.    In addition after 30 to 60 days of treatment I would like to see her again reevaluate her Epworth score and if this remains high I will invite her for narcolepsy evaluation.   Larey Seat, MD 10/06/3808, 17:51 AM  Certified in Neurology by ABPN Certified in [place] by [facility] [address] [place], [place] [address]

## 2017-06-28 NOTE — Patient Instructions (Signed)
I believe you may have a condition called narcolepsy: This means, that you have a sleep disorder that manifests with at times severe excessive sleepiness during the day and often with problems with sleep at night. We will first evaluate you for sleep apnea, the most common sleep disorder causing hypersomnia. Modafinil tablets What is this medicine? MODAFINIL (moe DAF i nil) is used to treat excessive sleepiness caused by certain sleep disorders. This includes narcolepsy, sleep apnea, and shift work sleep disorder. This medicine may be used for other purposes; ask your health care provider or pharmacist if you have questions. COMMON BRAND NAME(S): Provigil What should I tell my health care provider before I take this medicine? They need to know if you have any of these conditions: -history of depression, mania, or other mental disorder -kidney disease -liver disease -an unusual or allergic reaction to modafinil, other medicines, foods, dyes, or preservatives -pregnant or trying to get pregnant -breast-feeding How should I use this medicine? Take this medicine by mouth with a glass of water. Follow the directions on the prescription label. Take your doses at regular intervals. Do not take your medicine more often than directed. Do not stop taking except on your doctor's advice. A special MedGuide will be given to you by the pharmacist with each prescription and refill. Be sure to read this information carefully each time. Talk to your pediatrician regarding the use of this medicine in children. This medicine is not approved for use in children. Overdosage: If you think you have taken too much of this medicine contact a poison control center or emergency room at once. NOTE: This medicine is only for you. Do not share this medicine with others. What if I miss a dose? If you miss a dose, take it as soon as you can. If it is almost time for your next dose, take only that dose. Do not take double or  extra doses. What may interact with this medicine? Do not take this medicine with any of the following medications: -amphetamine or dextroamphetamine -dexmethylphenidate or methylphenidate -medicines called MAO Inhibitors like Nardil, Parnate, Marplan, Eldepryl -pemoline -procarbazine This medicine may also interact with the following medications: -antifungal medicines like itraconazole or ketoconazole -barbiturates like phenobarbital -birth control pills or other hormone-containing birth control devices or implants -carbamazepine -cyclosporine -diazepam -medicines for depression, anxiety, or psychotic disturbances -phenytoin -propranolol -triazolam -warfarin This list may not describe all possible interactions. Give your health care provider a list of all the medicines, herbs, non-prescription drugs, or dietary supplements you use. Also tell them if you smoke, drink alcohol, or use illegal drugs. Some items may interact with your medicine. What should I watch for while using this medicine? Visit your doctor or health care professional for regular checks on your progress. The full effects of this medicine may not be seen right away. This medicine may affect your concentration, function, or may hide signs that you are tired. You may get dizzy. Do not drive, use machinery, or do anything that needs mental alertness until you know how this drug affects you. Alcohol can make you more dizzy and may interfere with your response to this medicine or your alertness. Avoid alcoholic drinks. Birth control pills may not work properly while you are taking this medicine. Talk to your doctor about using an extra method of birth control. It is unknown if the effects of this medicine will be increased by the use of caffeine. Caffeine is available in many foods, beverages, and medications. Ask your  doctor if you should limit or change your intake of caffeine-containing products while on this medicine. What  side effects may I notice from receiving this medicine? Side effects that you should report to your doctor or health care professional as soon as possible: -allergic reactions like skin rash, itching or hives, swelling of the face, lips, or tongue -anxiety -breathing problems -chest pain -fast, irregular heartbeat -hallucinations -increased blood pressure -redness, blistering, peeling or loosening of the skin, including inside the mouth -sore throat, fever, or chills -suicidal thoughts or other mood changes -tremors -vomiting Side effects that usually do not require medical attention (report to your doctor or health care professional if they continue or are bothersome): -headache -nausea, diarrhea, or stomach upset -nervousness -trouble sleeping This list may not describe all possible side effects. Call your doctor for medical advice about side effects. You may report side effects to FDA at 1-800-FDA-1088. Where should I keep my medicine? Keep out of the reach of children. This medicine can be abused. Keep your medicine in a safe place to protect it from theft. Do not share this medicine with anyone. Selling or giving away this medicine is dangerous and against the law. This medicine may cause accidental overdose and death if taken by other adults, children, or pets. Mix any unused medicine with a substance like cat litter or coffee grounds. Then throw the medicine away in a sealed container like a sealed bag or a coffee can with a lid. Do not use the medicine after the expiration date. Store at room temperature between 20 and 25 degrees C (68 and 77 degrees F). NOTE: This sheet is a summary. It may not cover all possible information. If you have questions about this medicine, talk to your doctor, pharmacist, or health care provider.  2018 Elsevier/Gold Standard (2013-11-05 15:34:55)    We may have to try different medications that may help you stay awake during the day. Not everything  works with everybody the same way. Wake promoting agents include stimulants and non-stimulant type medications. The most common side effects with stimulants are weight loss, insomnia, nervousness, headaches, palpitations, rise in blood pressure, anxiety. Stimulants can be addictive and subject to abuse. Non-stimulant type wake promoting medications include Provigil and Nuvigil, most common side effects include headaches, nervousness, insomnia, hypertension. In addition there is a medication called Xyrem which has been proven to be very effective in patients with narcolepsy with or without cataplexy. Some patients with narcolepsy report episodes of weakness, such as jaw or facial weakness, legs giving out, feeling wobbly or like "Jell-o", etc. in situations of anxiety, stress, laughter, sudden sadness, surprise, etc., which is called cataplexy. You can also experience episodes of sleep paralysis during which you may feel unable to move upon awakening. Some people experience dreamlike sequences upon awakening or upon drifting off to sleep, called hypnopompic or hypnagogic hallucinations.

## 2017-06-29 ENCOUNTER — Ambulatory Visit (INDEPENDENT_AMBULATORY_CARE_PROVIDER_SITE_OTHER): Payer: 59 | Admitting: Neurology

## 2017-06-29 DIAGNOSIS — G4730 Sleep apnea, unspecified: Secondary | ICD-10-CM

## 2017-06-29 DIAGNOSIS — G4733 Obstructive sleep apnea (adult) (pediatric): Secondary | ICD-10-CM | POA: Diagnosis not present

## 2017-06-29 DIAGNOSIS — G4710 Hypersomnia, unspecified: Secondary | ICD-10-CM

## 2017-06-29 DIAGNOSIS — F411 Generalized anxiety disorder: Secondary | ICD-10-CM | POA: Diagnosis not present

## 2017-06-29 DIAGNOSIS — G4719 Other hypersomnia: Secondary | ICD-10-CM

## 2017-06-29 DIAGNOSIS — G4753 Recurrent isolated sleep paralysis: Secondary | ICD-10-CM

## 2017-06-29 DIAGNOSIS — G4759 Other parasomnia: Secondary | ICD-10-CM

## 2017-06-30 MED FILL — VIIBRYD 40 MG TABLET: 40 | 90 days supply | Qty: 90 | Fill #0

## 2017-06-30 MED FILL — MODAFINIL 200 MG TABLET: 200 | 30 days supply | Qty: 30 | Fill #0

## 2017-07-03 ENCOUNTER — Encounter: Payer: Self-pay | Admitting: Neurology

## 2017-07-03 ENCOUNTER — Telehealth: Payer: Self-pay | Admitting: Neurology

## 2017-07-03 NOTE — Telephone Encounter (Signed)
Called patient to discuss sleep study results. No answer at this time. LVM for the patient to call back.   

## 2017-07-03 NOTE — Procedures (Signed)
PATIENT'S NAME:  Cindy, Andrade DOB:      January 24, 1976      MR#:    [id number]     DATE OF RECORDING: 06/29/2017 REFERRING M.D.:  Briscoe Deutscher, DO Study Performed:  Split-Night Titration Study HISTORY: Cindy Andrade is a 42 y.o. female patient seen for a new evaluation of sleep apnea, witnessed apnea and snoring per husband. 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  Her excessive daytime sleepiness affects her work, her social life her driving safety.  She was evaluated for sleep apnea about 9 years ago at [facility] and at the time was diagnosed with obstructive sleep apnea but could not tolerate the CPAP treatment. At the time her IBS was also affecting her, forcing her to go many times each night to the toilet.  In the meantime she is re-married, her husband has witnessed her to snore and have apneas.  She sometimes has to call a friend on her cell phone while driving to keep her awake, she has trouble to concentrate at work fatigue and excessive daytime sleepiness have affected her productivity her reliability.  She is quite anxious at this time, she drinks a lot of caffeine in order to keep herself awake, but she has refrained from energy drinks.  She dreams a lot, often vivid, nightmarish dreams which she interacts with, enacts. Sleep paralysis, dream intrusion. Has sleep-walking episodes. She shakes when she gets angry, her knees may buckle. She naps whenever she is physically inactive and mentally not stimulated. Excessive daytime sleepiness, Anxiety, Arthritis, Asthma, Depression, and OSA. Treated with Viibryd which prohibits an MSLT order.   The patient endorsed the Epworth Sleepiness Scale at 17/24 points  The patient's weight 248 pounds with a height of 63 (inches), resulting in a BMI of 43.8  kg/m2.The patient's neck circumference measured 16 inches.  CURRENT MEDICATIONS: Bentyl, Advil, Claritin, Bactroban, OTC, Viibryd.  PROCEDURE:  This is a multichannel digital polysomnogram utilizing the SomnoStar 11.2 system.  Electrodes and sensors were applied and monitored per AASM Specifications.   EEG, EOG, Chin and Limb EMG, were sampled at 200 Hz.  ECG, Snore and Nasal Pressure, Thermal Airflow, Respiratory Effort, CPAP Flow and Pressure, Oximetry was sampled at 50 Hz. Digital video and audio were recorded.      BASELINE STUDY WITHOUT CPAP RESULTS: Lights Out was at 22:25 and Lights On at 04:55.  Total recording time (TRT) was 165.5, with a total sleep time (TST) of 130 minutes.  The patient's sleep latency was 15 minutes. The sleep efficiency was 78.5 %.  SLEEP ARCHITECTURE: WASO (Wake after sleep onset) was 9.5 minutes, Stage N1 was 4.5 minutes, Stage N2 was 63.5 minutes, Stage N3 was 62 minutes and Stage R (REM sleep) was 0 minutes.  The percentages were Stage N1 3.5%, Stage N2 48.8%, Stage N3 47.7% and Stage R (REM sleep) 0%.   RESPIRATORY ANALYSIS:  There were a total of 64 respiratory events:  33 obstructive apneas, 0 central apneas and 31 hypopneas. The patient also had 0 respiratory event related arousals (RERAs). Snoring was noted. The total APNEA/HYPOPNEA INDEX (AHI) was 29.5 /hour and the total RESPIRATORY DISTURBANCE INDEX was 29.5 /hour.  There was no REM sleep and 62 events in NREM. The patient spent 165 minutes sleep time in the supine position 86 minutes in non-supine. The  supine AHI was 64.7 /hour versus a non-supine AHI of 11.2 /hour.  OXYGEN SATURATION & C02:  The wake baseline 02 saturation was 98%, with the lowest being 81%. Time spent below 89% saturation equaled 10 minutes.  PERIODIC LIMB MOVEMENTS: The patient had a total of 5 Periodic Limb Movements.  The Periodic Limb Movement (PLM) index was 2.3 /hour and the PLM Arousal index was 0.5 /hour. The arousals were noted as:  11 were spontaneous, 1 was associated with PLMs, and 7 were associated with respiratory events.  Audio and video analysis did not show any abnormal or unusual movements, behaviors, phonations or vocalizations. The patient took bathroom breaks. Snoring was noted EKG was in keeping with normal sinus rhythm (NSR)  TITRATION STUDY WITH CPAP RESULTS:   CPAP was initiated at 5 cmH20 with heated humidity per AASM split night standards and pressure was advanced to 11 cmH20 because of hypopneas, apneas and desaturations.  At a PAP pressure of 11 cmH20, there was a reduction of the AHI to 0 /hour on a Respironics dream wear FFM in small size.  Total recording time (TRT) was 225 minutes, with a total sleep time (TST) of 120.5 minutes. The patient's sleep latency was 121.5 minutes. REM latency was 0 minutes.  The sleep efficiency was 53.6 %.    SLEEP ARCHITECTURE: Wake after sleep was 8 minutes, Stage N1 9 minutes, Stage N2 67 minutes, Stage N3 44.5 minutes and Stage R (REM sleep) 0 minutes. The percentages were: Stage N1 7.5%, Stage N2 55.6%, Stage N3 36.9% and Stage R (REM sleep) 0%. The sleep architecture was notable for Rem void sleep.  RESPIRATORY ANALYSIS:  There were a total of 23 respiratory events: 11 obstructive apneas, 0 central apneas and 0 mixed apneas with 12 hypopneas. The patient also had 0 respiratory event related arousals (RERAs). The total APNEA/HYPOPNEA INDEX (AHI) was 11.5 /hour and the total RESPIRATORY DISTURBANCE INDEX was 11.5/hour.  0 events occurred in REM sleep and 23 events in NREM. The REM AHI was 0.0 /hour versus a non-REM AHI of 11.5/hour. The patient spent 100% of total sleep time in the supine position.   OXYGEN SATURATION & C02:  The wake baseline 02 saturation was 96%, with the lowest being 91%. Time spent below 89% saturation equaled 0 minutes.  PERIODIC LIMB MOVEMENTS:    The patient had a total of 0 Periodic Limb Movements. The arousals were noted as: 12 were  spontaneous, 0 were associated with PLMs, and 9 were associated with respiratory events.  POLYSOMNOGRAPHY IMPRESSION :   1. Obstructive Sleep Apnea (OSA) with AHI of 29.5, no clinically significant associated hypoxemia. 2. CPAP on FFM was surprisingly well tolerated and at 10 and 11 cm CPAP pressure there was a complete resolution of the AHI.   3. Repetitive Intrusions of Sleep were decreased, but patient did not enter REM sleep.    RECOMMENDATIONS: auto-titration capable CPAP machine to be set at 11 cm water pressure with heated humidity and was fitted with a FFM, a Respironics dream wear in small.    Post-study, the patient indicated that sleep was shorter than usual but better than expected. A follow up appointment will be scheduled in the Sleep Clinic at [facility].      I certify that I have reviewed the entire raw data recording prior to the issuance of this report in accordance with the Standards of Accreditation of the [place] [employer] (AASM)      Asencion Partridge Markeese Boyajian,  M.D.   07-03-2017  Diplomat, American Board of Psychiatry and Neurology  Diplomat, [place] [employer] Market researcher, [employer] [facility] at [employer]

## 2017-07-03 NOTE — Addendum Note (Signed)
Addended by: Larey Seat on: 07/03/2017 01:58 PM   Modules accepted: Orders

## 2017-07-03 NOTE — Telephone Encounter (Signed)
-----   Message from Larey Seat, MD sent at 07/03/2017  1:57 PM EDT ----- 1. Obstructive Sleep Apnea (OSA) with AHI of 29.5, no clinically  significant associated hypoxemia. 2. CPAP on FFM was surprisingly well tolerated and at 10 and 11  cm CPAP pressure there was a complete resolution of the AHI.  3. Repetitive Intrusions of Sleep were decreased, but patient did  not enter REM sleep.   RECOMMENDATIONS: auto-titration capable CPAP machine to be set at  11 cm water pressure with heated humidity and was fitted with a  FFM, a Respironics dream wear in small.

## 2017-07-03 NOTE — Telephone Encounter (Signed)
Pt returned call. I advised pt that Dr. Brett Fairy reviewed their sleep study results and found that pt has sleep apnea. Dr. Brett Fairy recommends that pt starts a cpap at a pressure of 11 cm water pressure. I reviewed PAP compliance expectations with the pt. Pt is agreeable to starting a CPAP. I advised pt that an order will be sent to a DME, [facility], and [facility] will call the pt within about one week after they file with the pt's insurance. [facility] will show the pt how to use the machine, fit for masks, and troubleshoot the CPAP if needed. A follow up appt was made for insurance purposes with Cecille Rubin, NP on 10/12/17 at 3:15 pm. Pt verbalized understanding to arrive 15 minutes early and bring their CPAP. A letter with all of this information in it will be mailed to the pt as a reminder. I verified with the pt that the address we have on file is correct. Pt verbalized understanding of results. Pt had no questions at this time but was encouraged to call back if questions arise.

## 2017-07-05 ENCOUNTER — Encounter: Payer: Self-pay | Admitting: Neurology

## 2017-07-05 LAB — NARCOLEPSY EVALUATION
DQA1*01:02: NEGATIVE
DQB1*06:02: NEGATIVE

## 2017-07-06 DIAGNOSIS — F411 Generalized anxiety disorder: Secondary | ICD-10-CM | POA: Diagnosis not present

## 2017-07-11 ENCOUNTER — Encounter: Payer: Self-pay | Admitting: Neurology

## 2017-07-12 ENCOUNTER — Encounter: Payer: Self-pay | Admitting: Neurology

## 2017-07-12 DIAGNOSIS — S134XXA Sprain of ligaments of cervical spine, initial encounter: Secondary | ICD-10-CM | POA: Diagnosis not present

## 2017-07-12 DIAGNOSIS — M5012 Mid-cervical disc disorder, unspecified level: Secondary | ICD-10-CM | POA: Diagnosis not present

## 2017-07-12 DIAGNOSIS — M5412 Radiculopathy, cervical region: Secondary | ICD-10-CM | POA: Diagnosis not present

## 2017-07-12 DIAGNOSIS — S060X1A Concussion with loss of consciousness of 30 minutes or less, initial encounter: Secondary | ICD-10-CM | POA: Diagnosis not present

## 2017-07-18 DIAGNOSIS — G4733 Obstructive sleep apnea (adult) (pediatric): Secondary | ICD-10-CM | POA: Diagnosis not present

## 2017-07-21 ENCOUNTER — Ambulatory Visit: Payer: 59 | Admitting: Family Medicine

## 2017-07-21 ENCOUNTER — Encounter: Payer: Self-pay | Admitting: Family Medicine

## 2017-07-21 VITALS — BP 132/74 | HR 99 | Temp 99.2°F | Ht 63.0 in | Wt 246.4 lb

## 2017-07-21 DIAGNOSIS — R059 Cough, unspecified: Secondary | ICD-10-CM

## 2017-07-21 DIAGNOSIS — R05 Cough: Secondary | ICD-10-CM

## 2017-07-21 DIAGNOSIS — J3489 Other specified disorders of nose and nasal sinuses: Secondary | ICD-10-CM | POA: Diagnosis not present

## 2017-07-21 MED ORDER — BENZONATATE 200 MG PO CAPS: 200.0000 mg | ORAL_CAPSULE | Freq: Two times a day (BID) | ORAL | 0 refills | Status: DC | PRN

## 2017-07-21 MED ORDER — IPRATROPIUM BROMIDE 0.06 % NA SOLN: 2.0000 | Freq: Four times a day (QID) | NASAL | 0 refills | Status: DC

## 2017-07-21 MED ORDER — AZITHROMYCIN 250 MG PO TABS: ORAL_TABLET | ORAL | 0 refills | Status: DC

## 2017-07-21 MED FILL — AZITHROMYCIN 250 MG TABLET: 250 | 5 days supply | Qty: 6 | Fill #0

## 2017-07-21 MED FILL — BENZONATATE 200 MG CAP: 200 | 10 days supply | Qty: 20 | Fill #0

## 2017-07-21 MED FILL — IPRATROPIUM 0.06% SPRAY: 0.06 | 10 days supply | Qty: 15 | Fill #0

## 2017-07-21 NOTE — Patient Instructions (Signed)
Start the atrovent.  Start tessalon for your cough.  Start the zpack if your symptoms worsen or do not improve in a few days.  Please stay well hydrated.  You can take tylenol and/or motrin as needed for low grade fever and pain.  Please let me know if your symptoms worsen or fail to improve.  Take care, Dr Parker  

## 2017-07-21 NOTE — Progress Notes (Signed)
    Subjective:  Cindy Andrade is a 42 y.o. female who presents today for same-day appointment with a chief complaint of cough.   HPI:  Cough, acute problem Started 2 days ago. Symptoms are stable over the last couple of days. Associated symptoms include with sinus pressure, fatigue, rhinorrhea, some disequilibirum, some ear pressure, and a little diarrhea. Tried claritin-D, nyquil, and vitamin C. No fevers or chills. No obvious sick contacts. No obvious alleviating or aggravating factors.    ROS: Per HPI  PMH: She reports that she has never smoked. She has never used smokeless tobacco. She reports that she drinks alcohol. She reports that she does not use drugs.  Objective:  Physical Exam: BP 132/74 (BP Location: Right Arm, Patient Position: Sitting, Cuff Size: Large)   Pulse 99   Temp 99.2 F (37.3 C) (Oral)   Ht 5\' 3"  (1.6 m)   Wt 246 lb 6.4 oz (111.8 kg)   LMP 07/16/2017   SpO2 96%   BMI 43.65 kg/m   Gen: NAD, resting comfortably HEENT: Narrow EAC bilaterally TM partially obstructed by cerumen however clear.  Oropharynx erythematous without exudate.  Nasal mucosa boggy and erythematous with clear nasal discharge.  Maxillary sinuses with decreased transillumination bilaterally. CV: RRR with no murmurs appreciated Pulm: NWOB, CTAB with no crackles, wheezes, or rhonchi  Assessment/Plan:  Cough Likely secondary to viral URI. No signs of bacterial infection. Start atrovent for rhinorrhea/sinus congestion. Start tessalon for cough. Sent in a "pocket prescription" for azithromycin with strict instruction to not start unless symptoms worsen or fail to improve within the next several days. Recommended tylenol and/or motrin as needed for low grade fever and pain. Encouraged good oral hydration. Return precautions reviewed. Follow up as needed.   Algis Greenhouse. Jerline Pain, MD 07/21/2017 3:12 PM

## 2017-07-21 NOTE — Addendum Note (Signed)
Addended by: Vivi Barrack on: 07/21/2017 03:41 PM   Modules accepted: Orders

## 2017-08-10 DIAGNOSIS — F411 Generalized anxiety disorder: Secondary | ICD-10-CM | POA: Diagnosis not present

## 2017-08-15 DIAGNOSIS — S060X1A Concussion with loss of consciousness of 30 minutes or less, initial encounter: Secondary | ICD-10-CM | POA: Diagnosis not present

## 2017-08-15 DIAGNOSIS — S134XXA Sprain of ligaments of cervical spine, initial encounter: Secondary | ICD-10-CM | POA: Diagnosis not present

## 2017-08-15 DIAGNOSIS — M5012 Mid-cervical disc disorder, unspecified level: Secondary | ICD-10-CM | POA: Diagnosis not present

## 2017-08-15 DIAGNOSIS — M5412 Radiculopathy, cervical region: Secondary | ICD-10-CM | POA: Diagnosis not present

## 2017-08-24 DIAGNOSIS — F411 Generalized anxiety disorder: Secondary | ICD-10-CM | POA: Diagnosis not present

## 2017-09-08 ENCOUNTER — Institutional Professional Consult (permissible substitution): Payer: Self-pay | Admitting: Internal Medicine

## 2017-09-19 DIAGNOSIS — M5012 Mid-cervical disc disorder, unspecified level: Secondary | ICD-10-CM | POA: Diagnosis not present

## 2017-09-19 DIAGNOSIS — S134XXA Sprain of ligaments of cervical spine, initial encounter: Secondary | ICD-10-CM | POA: Diagnosis not present

## 2017-09-19 DIAGNOSIS — M5412 Radiculopathy, cervical region: Secondary | ICD-10-CM | POA: Diagnosis not present

## 2017-09-19 DIAGNOSIS — S060X1A Concussion with loss of consciousness of 30 minutes or less, initial encounter: Secondary | ICD-10-CM | POA: Diagnosis not present

## 2017-09-21 DIAGNOSIS — F411 Generalized anxiety disorder: Secondary | ICD-10-CM | POA: Diagnosis not present

## 2017-09-27 MED FILL — VIIBRYD 40 MG TABLET: 40 | 90 days supply | Qty: 90 | Fill #1

## 2017-09-30 ENCOUNTER — Encounter: Payer: Self-pay | Admitting: Family Medicine

## 2017-10-04 DIAGNOSIS — S060X1A Concussion with loss of consciousness of 30 minutes or less, initial encounter: Secondary | ICD-10-CM | POA: Diagnosis not present

## 2017-10-04 DIAGNOSIS — M5412 Radiculopathy, cervical region: Secondary | ICD-10-CM | POA: Diagnosis not present

## 2017-10-04 DIAGNOSIS — S134XXA Sprain of ligaments of cervical spine, initial encounter: Secondary | ICD-10-CM | POA: Diagnosis not present

## 2017-10-04 DIAGNOSIS — M5012 Mid-cervical disc disorder, unspecified level: Secondary | ICD-10-CM | POA: Diagnosis not present

## 2017-10-05 DIAGNOSIS — F411 Generalized anxiety disorder: Secondary | ICD-10-CM | POA: Diagnosis not present

## 2017-10-08 ENCOUNTER — Encounter: Payer: Self-pay | Admitting: Neurology

## 2017-10-10 ENCOUNTER — Ambulatory Visit: Payer: 59 | Admitting: Neurology

## 2017-10-10 ENCOUNTER — Encounter: Payer: Self-pay | Admitting: Neurology

## 2017-10-10 VITALS — BP 109/69 | HR 72 | Ht 64.0 in | Wt 228.0 lb

## 2017-10-10 DIAGNOSIS — Z9989 Dependence on other enabling machines and devices: Secondary | ICD-10-CM | POA: Diagnosis not present

## 2017-10-10 DIAGNOSIS — G4733 Obstructive sleep apnea (adult) (pediatric): Secondary | ICD-10-CM

## 2017-10-10 NOTE — Progress Notes (Signed)
SLEEP MEDICINE CLINIC   Provider:  Larey Seat, M D  Primary Care Physician:  Briscoe Deutscher, DO   Referring Provider: Briscoe Deutscher, DO    Chief Complaint  Patient presents with  . Follow-up    pt alone, rm 10. pt states CPAP is going well. she states that she has had a diffficulty with humidification settings. wants to discuss medication vs caffiene use. she stopped taking the modafinil once she got the CPAP and was doing ok until last 2 wks she has noticed daytime sleepiness increasing    HPI:  Cindy Andrade is a 42 y.o. female patient, and she underwent a sleep study by [facility] protocol, dated 06-29-2017;  Her sleep study documented first a history of excessive daytime sleepiness affecting work and safety of driving and the Epworth sleepiness score was endorsed at 17 out of 24 points, at the time her BMI was about 44 kg/m, the patient sleep study documented an AHI of 29.5, with a strong supine sleep accentuation to an AHI of 64.7.  Since there was no REM sleep recorded we could not see if sleep apnea was also REM sleep dependent or accentuated.  She did not have prolonged oxygen desaturations only 10-minute total desaturation time and a sleep time of 130 minutes.  Very few periodic limb movements normal sinus rhythm per EKG.  Titration started at 5 cmH2O and was advanced to 11 at 11 cmH2O she experienced an AHI of 0.0.  The technologist had given her a Respironics DreamWear full facemask in small size.  Sleep efficiency was lower on CPAP done during the baseline study which is expected in the first night on CPAP.  There was a complete resolution of the apnea at 11 cmH2O pressure.  The patient has been by tried several masks but settled on this model.  She has been 97% compliant for the last 30 days is a download being dated 08 October 2017.  Average use of time on days used is 5 hours 7 minutes, CPAP is set at 11 cmH2O pressure with 3 cm EPR there are minor air leaks only but to my  surprise there is still an AHI of 4.6.  It seems that the apneas were mostly obstructive in nature 2.3 of the residual AHI.  No Cheyne-Stokes respirations.  She responded well in improving her sleepiness, and was very safe while driving, felt alert. She started a weight loss program and lost 20 pounds in the meantime. Her sleepiness became again more noticable about 10- 14 days ago, she is unsure why. There are no major leaks. She is still trying different humidity , she still is a mouth breather.  We will change her to an auto setting-  7-12 cm water, 3 cm EPR.     This is our Sleep Consult - April 2019 -She was  seen here as in a referral from Dr. Juleen China for a new evaluation of sleep apnea, witnessed apnea and snoring per husband.  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 Morbid obesity. Memory issues. Monday left work after supervisor noted her to sleep, fell almost asleep driving.  Is a pleasure of seeing [name] today as a new patient on 28 Jun 2017, and she reports how to start and anxious she has become in relation to her hypersomnia.  Her excessive daytime sleepiness affects her work, her social life her driving safety.  She was evaluated for sleep apnea about 9 years ago at [facility] and at the time was  diagnosed with obstructive sleep apnea but could not tolerate the treatment.  She was placed on CPAP but at the time her then spouse was not very supportive and she felt that this was  another intrusion into her marital life. At the time her IBS was also affecting her.  In the meantime she is been married, her husband has witnessed her to snore and have apneas.  She sometimes has to call a friend on her cell phone while driving to keep her awake, she has trouble to concentrate at work fatigue and excessive daytime sleepiness have affected her productivity her reliability.  She is quite anxious at this time, she drinks a lot of caffeine in order to keep herself awake, but  she has refrained from energy drinks.   Sleep habits are as follows: She does not have trouble going to sleep, she usually tries to keep her brain active by playing on her phone possible games etc. and this also helps her to get a buffer between her and her revolving thoughts .  She describes her marital bedroom is cool, quiet and dark, she is asleep was less than 10 minutes once she goes to bed about 9.45 PM  She has a bedroom with her husband.  He comes to bed later , by around one hour. She does avoid supine sleep position because she wakes up gasping, however she sometimes still inadvertently able to resume supine sleep.  She has tried to train herself to sleep on her side.  Because of back pain she is not able to sleep prone.  In order to prevent cervicalgia she is using an orthopedic pillow and one between the legs.  Waking up at 5.30 alarm,  She feels she needs several alarms, struggles to get up.  She dreams a lot, often vivid, nightmarish dreams which she interacts with, enacts. Sleep paralysis, dream intrusion. Has sleep- walking episodes. She shakes when she gest angry, but her knees buckle. Naps whenever physically not active and mentally not stimulated. Falling asleep at work.  .    Sleep medical history and family sleep history: father has OSA. He worked as a Pharmacist, community and took naps in daytime.  Social history: remarried, no children. Salpingectomy. Anxiety disorder. Loss of libido, weight gain. Has pet dogs, one sleeps in the bedroom.  Has caffeine overuse due to hypersomnia, never smoked, asthma- ETOH very seldomly.  The patient cut significantly down on caffeine- now one cup of coffee down from 5-8. ( 11-10-2017 ). She has started a dietary plan and is losing weight- related to Medifast. She has recovered from GI problems now. HLA was negative.   Review of Systems: Out of a complete 14 system review, the patient complains of only the following symptoms, and all other reviewed systems are  negative.  pre OSA diagnosis EDS- excessive sleepiness, irresistible urge to go to sleep, sleep paralysis, naps, sleep attacks. possible cataplexy.   Epworth score was 17 out of 24 before CPAP and now endorsed at 8/ 24 - much improved ! ,  Fatigue severity score down to 29 points from 58/ 63 points   , depression score deferred.   Social History   Socioeconomic History  . Marital status: Married    Spouse name: Not on file  . Number of children: Not on file  . Years of education: Not on file  . Highest education level: Not on file  Occupational History    Employer: [employer]  . Emergency planning/management officer  strain: Not on file  . Food insecurity:    Worry: Not on file    Inability: Not on file  . Transportation needs:    Medical: Not on file    Non-medical: Not on file  Tobacco Use  . Smoking status: Never Smoker  . Smokeless tobacco: Never Used  Substance and Sexual Activity  . Alcohol use: Yes    Comment: Occasionally  . Drug use: Never  . Sexual activity: Not on file  Lifestyle  . Physical activity:    Days per week: Not on file    Minutes per session: Not on file  . Stress: Not on file  Relationships  . Social connections:    Talks on phone: Not on file    Gets together: Not on file    Attends religious service: Not on file    Active member of club or organization: Not on file    Attends meetings of clubs or organizations: Not on file    Relationship status: Not on file  . Intimate partner violence:    Fear of current or ex partner: Not on file    Emotionally abused: Not on file    Physically abused: Not on file    Forced sexual activity: Not on file  Other Topics Concern  . Not on file  Social History Narrative  . Not on file    Family History  Problem Relation Age of Onset  . Arthritis Mother   . Cancer Mother   . Cancer Father   . Depression Father   . Diabetes Father   . Hearing loss Father   . High Cholesterol Father   . Kidney disease  Father   . Stroke Father   . Sleep apnea Father   . Arthritis Maternal Grandmother   . Cancer Maternal Grandmother   . Depression Maternal Grandmother   . Birth defects Paternal Grandmother   . High Cholesterol Paternal Grandmother   . Heart attack Paternal Grandfather   . Heart disease Paternal Grandfather   . High Cholesterol Paternal Grandfather   . Hypertension Paternal Grandfather     Past Medical History:  Diagnosis Date  . Anxiety   . Arthritis   . Asthma   . Chicken pox   . Depression   . Hay fever   . Irritable bowel syndrome with both constipation and diarrhea 02/02/2014  . Obstructive sleep apnea syndrome 02/02/2014    Past Surgical History:  Procedure Laterality Date  . ANTERIOR CRUCIATE LIGAMENT REPAIR    . CHOLECYSTECTOMY  2012  . EYE SURGERY Bilateral   . SALPINGECTOMY      Current Outpatient Medications  Medication Sig Dispense Refill  . dicyclomine (BENTYL) 20 MG tablet Take 20 mg by mouth as needed.     Marland Kitchen ibuprofen (ADVIL,MOTRIN) 800 MG tablet Take by mouth.    Marland Kitchen ipratropium (ATROVENT) 0.06 % nasal spray Place 2 sprays into both nostrils 4 (four) times daily. 15 mL 0  . loratadine-pseudoephedrine (CLARITIN-D 12-HOUR) 5-120 MG tablet Take 1 tablet by mouth daily.    . mupirocin ointment (BACTROBAN) 2 % Place 1 application into the nose 2 (two) times daily. 22 g 0  . OVER THE COUNTER MEDICATION 1 tablet 2 (two) times daily. Magnesium, calcium citrate and vitamin D combo pill.     No current facility-administered medications for this visit.     Allergies as of 10/10/2017 - Review Complete 10/10/2017  Allergen Reaction Noted  . Bacitracin Rash 05/09/2017  . Neosporin  [  neomycin-bacitracin zn-polymyx] Rash 01/30/2014  . Other  10/11/2016  . Neomycin Rash 04/19/2015  . Tape Rash 08/05/2015    Vitals: BP 109/69   Pulse 72   Ht 5' 4"  (1.626 m)   Wt 228 lb (103.4 kg)   BMI 39.14 kg/m  Last Weight:  Wt Readings from Last 1 Encounters:  10/10/17  228 lb (103.4 kg)   DTO:IZTI mass index is 39.14 kg/m.     Last Height:   Ht Readings from Last 1 Encounters:  10/10/17 5' 4"  (1.626 m)    Physical exam:  General: The patient is awake, alert and appears not in acute distress. The patient is well groomed. Head: Normocephalic, atraumatic. Neck is supple. Mallampati 2-3 ,  nasal congestion, fractured septum . neck circumference: 16.  Retrognathia is seen.  Crowded dental status.  Cardiovascular:  Regular rate and rhythm , without  murmurs or carotid bruit, and without distended neck veins. Respiratory: Lungs are clear to auscultation.Skin:  Without evidence of edema, or rash. Trunk: BMI is down to 39.3 from 44 !Marland Kitchen  The patient's posture is erect.  Neurologic exam : The patient is awake and alert, oriented to place and time. She is happy, excited about CPAP and weight loss.  Memory subjective described as intact.   Attention span & concentration ability appears normal.  Speech is fluent,  without dysarthria, dysphonia or aphasia.  Mood and affect are happy.  Cranial nerves: Pupils are equal Facial motor strength is symmetric and tongue and uvula move midline. Shoulder shrug was symmetrical.  Motor exam:   Normal tone, muscle bulk and symmetric strength in all extremities. Good grip strength. Sensory:  Fine touch, pinprick and vibration were  normal. Coordination: Finger-to-nose maneuver  normal without evidence of ataxia, dysmetria or tremor. Gait and station: Patient walks without assistive device. Stance is stable and normal.   Deep tendon reflexes: in the upper and lower extremities are symmetric and intact. Babinski maneuver deferred.    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 EDS was most likely due to OSA , and she was diagnosed with the condition on 06-29-2017, AHI 29.5/h.  OSA largely related to BMI. Snoring since  childhood. She lost 60 pounds on Paleo in 2015-16 and she resumed 250 pounds soon after.  Diabetes prevention program through [facility]. She has now been able to reduce to reduce AHI from 44 to 39 kg/ m2   The patient was advised of the nature of the diagnosed disorder , the treatment options and the  risks for general health and wellness arising from not treating the condition.  I spent more than 25 minutes of face to face time with the patient.Greater than 50% of time was spent in counseling and coordination of care. We have discussed the diagnosis and differential and I answered the patient's questions.    Plan:  Treatment plan and additional workup :  Mrs. Urbani reports no longer excessive daytime sleepiness with sleep attacks, isolated sleep paralysis, dream intrusion sometimes parasomnia-like leaving the bed during dream activity since being on CPAP -. She does have some nasal patency issues. In addition after treatment on auto CPAP -I would like to see  her again in 3-6 month.  Set auto CPAP to a range of  7-12 cm water auto setting , with 3 cm EPR. Keep mask and model. Avoid supine sleep. 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     Larey Seat, MD 8/47/8412, 8:20 AM  Certified in Neurology by ABPN Certified in [place] by [facility] [address], [place], [place] [address]

## 2017-10-12 ENCOUNTER — Ambulatory Visit: Payer: Self-pay | Admitting: Nurse Practitioner

## 2017-10-18 DIAGNOSIS — M5012 Mid-cervical disc disorder, unspecified level: Secondary | ICD-10-CM | POA: Diagnosis not present

## 2017-10-18 DIAGNOSIS — S134XXA Sprain of ligaments of cervical spine, initial encounter: Secondary | ICD-10-CM | POA: Diagnosis not present

## 2017-10-18 DIAGNOSIS — M5412 Radiculopathy, cervical region: Secondary | ICD-10-CM | POA: Diagnosis not present

## 2017-10-18 DIAGNOSIS — S060X1A Concussion with loss of consciousness of 30 minutes or less, initial encounter: Secondary | ICD-10-CM | POA: Diagnosis not present

## 2017-10-19 DIAGNOSIS — F411 Generalized anxiety disorder: Secondary | ICD-10-CM | POA: Diagnosis not present

## 2017-10-31 ENCOUNTER — Encounter: Payer: Self-pay | Admitting: Neurology

## 2017-10-31 NOTE — Telephone Encounter (Signed)
This must relate to a CPAP diagnostic or compliance  download

## 2017-11-01 DIAGNOSIS — M5012 Mid-cervical disc disorder, unspecified level: Secondary | ICD-10-CM | POA: Diagnosis not present

## 2017-11-01 DIAGNOSIS — M5412 Radiculopathy, cervical region: Secondary | ICD-10-CM | POA: Diagnosis not present

## 2017-11-01 DIAGNOSIS — S060X1A Concussion with loss of consciousness of 30 minutes or less, initial encounter: Secondary | ICD-10-CM | POA: Diagnosis not present

## 2017-11-01 DIAGNOSIS — S134XXA Sprain of ligaments of cervical spine, initial encounter: Secondary | ICD-10-CM | POA: Diagnosis not present

## 2017-11-02 DIAGNOSIS — F411 Generalized anxiety disorder: Secondary | ICD-10-CM | POA: Diagnosis not present

## 2017-11-06 DIAGNOSIS — G4733 Obstructive sleep apnea (adult) (pediatric): Secondary | ICD-10-CM | POA: Diagnosis not present

## 2017-11-14 ENCOUNTER — Encounter: Payer: Self-pay | Admitting: Neurology

## 2017-11-14 DIAGNOSIS — S134XXA Sprain of ligaments of cervical spine, initial encounter: Secondary | ICD-10-CM | POA: Diagnosis not present

## 2017-11-14 DIAGNOSIS — M5412 Radiculopathy, cervical region: Secondary | ICD-10-CM | POA: Diagnosis not present

## 2017-11-14 DIAGNOSIS — S060X1A Concussion with loss of consciousness of 30 minutes or less, initial encounter: Secondary | ICD-10-CM | POA: Diagnosis not present

## 2017-11-14 DIAGNOSIS — M5012 Mid-cervical disc disorder, unspecified level: Secondary | ICD-10-CM | POA: Diagnosis not present

## 2017-11-30 DIAGNOSIS — F411 Generalized anxiety disorder: Secondary | ICD-10-CM | POA: Diagnosis not present

## 2017-12-05 DIAGNOSIS — M5412 Radiculopathy, cervical region: Secondary | ICD-10-CM | POA: Diagnosis not present

## 2017-12-05 DIAGNOSIS — M5012 Mid-cervical disc disorder, unspecified level: Secondary | ICD-10-CM | POA: Diagnosis not present

## 2017-12-05 DIAGNOSIS — S134XXA Sprain of ligaments of cervical spine, initial encounter: Secondary | ICD-10-CM | POA: Diagnosis not present

## 2017-12-05 DIAGNOSIS — S060X1A Concussion with loss of consciousness of 30 minutes or less, initial encounter: Secondary | ICD-10-CM | POA: Diagnosis not present

## 2017-12-08 DIAGNOSIS — H52223 Regular astigmatism, bilateral: Secondary | ICD-10-CM | POA: Diagnosis not present

## 2017-12-08 DIAGNOSIS — H5201 Hypermetropia, right eye: Secondary | ICD-10-CM | POA: Diagnosis not present

## 2017-12-08 DIAGNOSIS — H1045 Other chronic allergic conjunctivitis: Secondary | ICD-10-CM | POA: Diagnosis not present

## 2017-12-08 DIAGNOSIS — H35413 Lattice degeneration of retina, bilateral: Secondary | ICD-10-CM | POA: Diagnosis not present

## 2017-12-08 MED FILL — LOTEMAX 0.5% EYE OINTMENT: 0.5 | 24 days supply | Qty: 4 | Fill #0

## 2017-12-14 ENCOUNTER — Encounter: Payer: Self-pay | Admitting: Family Medicine

## 2017-12-14 ENCOUNTER — Ambulatory Visit: Payer: 59 | Admitting: Family Medicine

## 2017-12-14 ENCOUNTER — Ambulatory Visit (INDEPENDENT_AMBULATORY_CARE_PROVIDER_SITE_OTHER): Payer: 59

## 2017-12-14 VITALS — BP 116/72 | HR 91 | Temp 98.8°F | Ht 64.0 in | Wt 221.6 lb

## 2017-12-14 DIAGNOSIS — M25552 Pain in left hip: Secondary | ICD-10-CM

## 2017-12-14 MED ORDER — CYCLOBENZAPRINE HCL 10 MG PO TABS: 10.0000 mg | ORAL_TABLET | Freq: Three times a day (TID) | ORAL | 0 refills | Status: DC | PRN

## 2017-12-14 MED ORDER — DICLOFENAC SODIUM 75 MG PO TBEC: 75.0000 mg | DELAYED_RELEASE_TABLET | Freq: Two times a day (BID) | ORAL | 0 refills | Status: DC

## 2017-12-14 MED FILL — DICLOFENAC SOD EC 75 MG TAB: 75 | 15 days supply | Qty: 30 | Fill #0

## 2017-12-14 MED FILL — CYCLOBENZAPRINE HCL 10 MG T: 10 | 10 days supply | Qty: 30 | Fill #0

## 2017-12-14 NOTE — Progress Notes (Signed)
   Subjective:  Cindy Andrade is a 42 y.o. female who presents today for same-day appointment with a chief complaint of hip pain.   HPI:  Left Hip Pain, Acute problem Started 3 weeks ago. Worsened over the last 4-5 days. Worse with first few steps, then wears off. No trauma or other obvious precipitating events, however did note that she slipped while mopping the floor a few days prior to the pain starting.  She has seen a chiropractor which has not helped.  She is also tried taking Motrin which does not help.  Pain described a sharp pain.  No other obvious alleviating or aggravating factors.  ROS: Per HPI  PMH: She reports that she has never smoked. She has never used smokeless tobacco. She reports that she drinks alcohol. She reports that she does not use drugs.  Objective:  Physical Exam: BP 116/72 (BP Location: Left Arm, Patient Position: Sitting, Cuff Size: Normal)   Pulse 91   Temp 98.8 F (37.1 C) (Oral)   Ht 5\' 4"  (1.626 m)   Wt 221 lb 9.6 oz (100.5 kg)   SpO2 99%   BMI 38.04 kg/m   Gen: NAD, resting comfortably MSK: -Back: No deformities.  Nontender to palpation. -Left hip: No deformities.  Nontender to palpation.  Mild weakness with hip flexion otherwise 5 out of 5 throughout.  Pain also elicited with Corky Sox maneuver. -Right hip: No deformities.  Nontender to palpation.  Strength 5 out of 5 throughout.  No pain with Faber maneuver.  Assessment/Plan:  Left hip pain Likely hip flexor strain based on history and physical exam.  No red flag signs or symptoms.  Her x-ray is negative based on my read-we will await radiology read.  Will start diclofenac and Flexeril.  Discussed home exercise program.  Referral to sports medicine if symptoms worsen or do not improve over the next 1 to 2 weeks.  Algis Greenhouse. Jerline Pain, MD 12/14/2017 2:24 PM

## 2017-12-14 NOTE — Patient Instructions (Signed)
It was very nice to see you today!  I think you have a strained muscle.  Please try the anti-inflammatory for the next 1-2 weeks.  Work on the exercises.  Let me know if your symptoms do not improve over the next 1-2 weeks.   Take care, Dr Jerline Pain

## 2017-12-15 NOTE — Progress Notes (Signed)
Dr Marigene Ehlers interpretation of your lab work:  The radiologist reviewed your xray and did not see any other abnormalities with your hip. You do have a small amount of arthritis in your back, however I do not think that this is causing your pain. Please work on the exercises we discussed and try the anti-inflammatories. Please let me or Dr Juleen China know if your symptoms worsen or do not improve in the next 1-2 weeks.   If you have any additional questions, please give [place] a call or send [place] a message through [place].  Take care, Dr Jerline Pain

## 2017-12-21 DIAGNOSIS — S060X1A Concussion with loss of consciousness of 30 minutes or less, initial encounter: Secondary | ICD-10-CM | POA: Diagnosis not present

## 2017-12-21 DIAGNOSIS — M5412 Radiculopathy, cervical region: Secondary | ICD-10-CM | POA: Diagnosis not present

## 2017-12-21 DIAGNOSIS — M5012 Mid-cervical disc disorder, unspecified level: Secondary | ICD-10-CM | POA: Diagnosis not present

## 2017-12-21 DIAGNOSIS — S134XXA Sprain of ligaments of cervical spine, initial encounter: Secondary | ICD-10-CM | POA: Diagnosis not present

## 2017-12-25 MED FILL — VIIBRYD 40 MG TABLET: 40 | 90 days supply | Qty: 90 | Fill #2

## 2018-01-09 DIAGNOSIS — M5012 Mid-cervical disc disorder, unspecified level: Secondary | ICD-10-CM | POA: Diagnosis not present

## 2018-01-09 DIAGNOSIS — S060X1A Concussion with loss of consciousness of 30 minutes or less, initial encounter: Secondary | ICD-10-CM | POA: Diagnosis not present

## 2018-01-09 DIAGNOSIS — M5412 Radiculopathy, cervical region: Secondary | ICD-10-CM | POA: Diagnosis not present

## 2018-01-09 DIAGNOSIS — S134XXA Sprain of ligaments of cervical spine, initial encounter: Secondary | ICD-10-CM | POA: Diagnosis not present

## 2018-01-11 DIAGNOSIS — F411 Generalized anxiety disorder: Secondary | ICD-10-CM | POA: Diagnosis not present

## 2018-02-08 DIAGNOSIS — F411 Generalized anxiety disorder: Secondary | ICD-10-CM | POA: Diagnosis not present

## 2018-02-13 ENCOUNTER — Encounter: Payer: Self-pay | Admitting: Neurology

## 2018-02-14 ENCOUNTER — Encounter: Payer: Self-pay | Admitting: Neurology

## 2018-02-14 ENCOUNTER — Ambulatory Visit: Payer: 59 | Admitting: Neurology

## 2018-02-14 VITALS — BP 119/76 | HR 77 | Ht 63.75 in | Wt 227.0 lb

## 2018-02-14 DIAGNOSIS — G4710 Hypersomnia, unspecified: Secondary | ICD-10-CM

## 2018-02-14 DIAGNOSIS — G4719 Other hypersomnia: Secondary | ICD-10-CM

## 2018-02-14 DIAGNOSIS — G4733 Obstructive sleep apnea (adult) (pediatric): Secondary | ICD-10-CM | POA: Diagnosis not present

## 2018-02-14 DIAGNOSIS — Z9989 Dependence on other enabling machines and devices: Secondary | ICD-10-CM

## 2018-02-14 NOTE — Progress Notes (Signed)
SLEEP MEDICINE CLINIC   Provider:  Larey Seat, M.D.   Primary Care Physician:  Briscoe Deutscher, DO   Referring Provider: Briscoe Deutscher, DO    Chief Complaint  Patient presents with  . Follow-up    pt alone, rm 11. pt states she is feeling tired throughout the day. she is averaging about 5-6 hours of sleep so that is most likely why and unable to get the full 8 hrs.      Interval History: 02-14-2018, I have the pleasure of seeing Cindy Andrade today in a revisit I have access to her CPAP compliance data. Baseline AHI was 54.7/h.  She has been using an AutoSet and she is using her CPAP but she has significant daytime sleepiness nonetheless.  Her compliance was 97% by days, 80% by time with an average user time of 5 hours and 44 minutes.  Her AHI residual is 2.4/h which is an excellent resolution of apnea, she is using an AutoSet between 7 and 12 cm water-with an expiratory pressure relief of 3 cmH2O.  95th percentile pressure is at 11.4, and she has no major air leakage.  She sleeps truly only 5 hours on CPAP- she almost had a MVA due to sleepiness.  Blood sugar levels have been stabile. BP has been great. RRR.  We need to rule out other causes of excessive daytime sleepiness.     She remains with an Epworth Sleepiness Scale of 17 points out of 24 possible points and the fatigue severity of 44 points out of 63 points.  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 She has been taking anti-depression medication, she has been using modafinil PRN.   HPI:  Cindy Andrade is a 42 y.o. female patient, and she underwent a sleep study by SPL[facility] protocol, dated 06-29-2017;Her sleep study documented first a history of excessive daytime sleepiness affecting work and safety of driving and the Epworth sleepiness score was endorsed at 17 out of 24 points, at the time her BMI was about 44 kg/m, the patient sleep study documented an AHI of 29.5, with a strong supine  sleep accentuation to an AHI of 64.7.  Since there was no REM sleep recorded we could not see if sleep apnea was also REM sleep dependent or accentuated.  She did not have prolonged oxygen desaturations only 10-minute total desaturation time and a sleep time of 130 minutes.  Very few periodic limb movements normal sinus rhythm per EKG.  Titration started at 5 cmH2O and was advanced to 11 at 11 cmH2O she experienced an AHI of 0.0.  The technologist had given her a Respironics DreamWear full facemask in small size.  Sleep efficiency was lower on CPAP done during the baseline study which is expected in the first night on CPAP.  There was a complete resolution of the apnea at 11 cmH2O pressure.  The patient has been by tried several masks but settled on this model.  She has been 97% compliant for the last 30 days is a download being dated 08 October 2017.  Average use of time on days used is 5 hours 7 minutes, CPAP is set at 11 cmH2O pressure with 3 cm EPR there are minor air leaks only but to my surprise there is still an AHI of 4.6.  It seems that the apneas were mostly obstructive in nature 2.3 of the residual AHI.  No Cheyne-Stokes respirations.  She responded well in improving her sleepiness, and was very safe  while driving, felt alert. She started a weight loss program and lost 20 pounds in the meantime. Her sleepiness became again more noticable about 10- 14 days ago, she is unsure why. There are no major leaks. She is still trying different humidity , she still is a mouth breather.  We will change her to an auto setting-  7-12 cm water, 3 cm EPR.     This is our Sleep Consult - April 2019 -She was  seen here as in a referral from Dr. Juleen China for a new evaluation of sleep apnea, witnessed apnea and snoring per husband.  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 Morbid obesity. Memory issues. Monday left work after supervisor noted her to sleep, fell almost asleep driving.  Is a pleasure of seeing [name] today as a new patient on 28 Jun 2017, and she reports how to start and anxious she has become in relation to her hypersomnia.  Her excessive daytime sleepiness affects her work, her social life her driving safety.  She was evaluated for sleep apnea about 9 years ago at [facility] and at the time was diagnosed with obstructive sleep apnea but could not tolerate the treatment.  She was placed on CPAP but at the time her then spouse was not very supportive and she felt that this was  another intrusion into her marital life. At the time her IBS was also affecting her.  In the meantime she is been married, her husband has witnessed her to snore and have apneas.  She sometimes has to call a friend on her cell phone while driving to keep her awake, she has trouble to concentrate at work fatigue and excessive daytime sleepiness have affected her productivity her reliability.  She is quite anxious at this time, she drinks a lot of caffeine in order to keep herself awake, but she has refrained from energy drinks.   Sleep habits are as follows: She does not have trouble going to sleep, she usually tries to keep her brain active by playing on her phone possible games etc. and this also helps her to get a buffer between her and her revolving thoughts .  She describes her marital bedroom is cool, quiet and dark, she is asleep was less than 10 minutes once she goes to bed about 9.45 PM  She has a bedroom with her husband.  He comes to bed later , by around one hour. She does avoid supine sleep position because she wakes up gasping, however she sometimes still inadvertently able to resume supine sleep.  She has tried to train herself to sleep on her side.  Because of back pain she is not able to sleep prone.  In order to prevent cervicalgia she is using an orthopedic pillow and one between the legs.  Waking up at 5.30 alarm,  She feels she needs several alarms, struggles to get up.  She dreams a lot,  often vivid, nightmarish dreams which she interacts with, enacts. Sleep paralysis, dream intrusion. Has sleep- walking episodes. She shakes when she gest angry, but her knees buckle. Naps whenever physically not active and mentally not stimulated. Falling asleep at work.  .    Sleep medical history and family sleep history: father has OSA. He worked as a Pharmacist, community and took naps in daytime.  Social history: remarried, no children. Salpingectomy. Anxiety disorder. Loss of libido, weight gain. Has pet dogs, one sleeps in the bedroom.  Has caffeine overuse due to hypersomnia, never smoked,  asthma- ETOH very seldomly.  The patient cut significantly down on caffeine- now one cup of coffee down from 5-8. ( 11-10-2017 ). She has started a dietary plan and is losing weight- related to Medifast. She has recovered from GI problems now. HLA was negative.   Review of Systems: Out of a complete 14 system review, the patient complains of only the following symptoms, and all other reviewed systems are negative.  pre OSA diagnosis EDS- excessive sleepiness, irresistible urge to go to sleep, sleep paralysis, naps, sleep attacks. possible cataplexy.   Epworth score was 17 out of 24 before CPAP and now endorsed at 8/ 24 - much improved ! ,  Fatigue severity score down to 29 points from 58/ 63 points   , depression score deferred.   Social History   Socioeconomic History  . Marital status: Married    Spouse name: Not on file  . Number of children: Not on file  . Years of education: Not on file  . Highest education level: Not on file  Occupational History    Employer: [employer]  . Financial resource strain: Not on file  . Food insecurity:    Worry: Not on file    Inability: Not on file  . Transportation needs:    Medical: Not on file    Non-medical: Not on file  Tobacco Use  . Smoking status: Never Smoker  . Smokeless tobacco: Never Used  Substance and Sexual Activity  . Alcohol use:  Yes    Comment: Occasionally  . Drug use: Never  . Sexual activity: Not on file  Lifestyle  . Physical activity:    Days per week: Not on file    Minutes per session: Not on file  . Stress: Not on file  Relationships  . Social connections:    Talks on phone:     Gets together:     Attends religious service:     Active member of club or organization:     Attends meetings of clubs or organizations:     Relationship status:   . Intimate partner violence:    Fear of current or ex partner:     Emotionally abused:     Physically abused:     Forced sexual activity:   Other Topics Concern  . Not on file  Social History Narrative  . Not on file    Family History  Problem Relation Age of Onset  . Arthritis Mother   . Cancer Mother   . Cancer Father   . Depression Father   . Diabetes Father   . Hearing loss Father   . High Cholesterol Father   . Kidney disease Father   . Stroke Father   . Sleep apnea Father   . Arthritis Maternal Grandmother   . Cancer Maternal Grandmother   . Depression Maternal Grandmother   . Birth defects Paternal Grandmother   . High Cholesterol Paternal Grandmother   . Heart attack Paternal Grandfather   . Heart disease Paternal Grandfather   . High Cholesterol Paternal Grandfather   . Hypertension Paternal Grandfather     Past Medical History:  Diagnosis Date  . Anxiety   . Arthritis   . Asthma   . Chicken pox   . Depression   . Hay fever   . Irritable bowel syndrome with both constipation and diarrhea 02/02/2014  . Obstructive sleep apnea syndrome 02/02/2014    Past Surgical History:  Procedure Laterality Date  .  ANTERIOR CRUCIATE LIGAMENT REPAIR    . CHOLECYSTECTOMY  2012  . EYE SURGERY Bilateral   . SALPINGECTOMY      Current Outpatient Medications  Medication Sig Dispense Refill  . dicyclomine (BENTYL) 20 MG tablet Take 20 mg by mouth as needed.     Marland Kitchen ibuprofen (ADVIL,MOTRIN) 800 MG tablet Take by mouth.    Marland Kitchen ipratropium  (ATROVENT) 0.06 % nasal spray Place 2 sprays into both nostrils 4 (four) times daily. 15 mL 0  . loratadine-pseudoephedrine (CLARITIN-D 12-HOUR) 5-120 MG tablet Take 1 tablet by mouth daily.    . mupirocin ointment (BACTROBAN) 2 % Place 1 application into the nose 2 (two) times daily. 22 g 0  . OVER THE COUNTER MEDICATION 1 tablet 2 (two) times daily. Magnesium, calcium citrate and vitamin D combo pill.    . Vilazodone HCl (VIIBRYD) 40 MG TABS Take 40 mg by mouth daily.     No current facility-administered medications for this visit.     Allergies as of 02/14/2018 - Review Complete 02/14/2018  Allergen Reaction Noted  . Bacitracin Rash 05/09/2017  . Neosporin  [neomycin-bacitracin zn-polymyx] Rash 01/30/2014  . Other  10/11/2016  . Neomycin Rash 04/19/2015  . Tape Rash 08/05/2015    Vitals: BP 119/76   Pulse 77   Ht 5' 3.75" (1.619 m)   Wt 227 lb (103 kg)   BMI 39.27 kg/m  Last Weight:  Wt Readings from Last 1 Encounters:  02/14/18 227 lb (103 kg)   CVE:LFYB mass index is 39.27 kg/m.     Last Height:   Ht Readings from Last 1 Encounters:  02/14/18 5' 3.75" (1.619 m)    Physical exam:  General: The patient is awake, alert and appears not in acute distress. The patient is well groomed. Head: Normocephalic, atraumatic. Neck is supple. Mallampati 2-3 ,  nasal congestion, fractured septum . neck circumference: 16.  Retrognathia is seen.  Crowded dental status.  Cardiovascular:  Regular rate and rhythm , without  murmurs or carotid bruit, and without distended neck veins. Respiratory: Lungs are clear to auscultation.Skin:  Without evidence of edema, or rash. Trunk: BMI is down to 39.27 from 44 !Marland Kitchen The patient's posture is erect.  Neurologic exam : The patient is awake and alert, oriented to place and time. She is happy, excited about CPAP and weight loss. She reports ongoing concern about EDS.   Memory subjective described as intact.   Attention span & concentration ability  appears normal.  Speech is fluent,  without dysarthria, dysphonia or aphasia.  Mood and affect are happy.  Cranial nerves: Pupils are equal, she had corrective eye surgery at age 89 - lazy eyes, strabism. Weakness of adduction on the left over right with fatigue, diplopia skewed images.   Facial motor strength is symmetric and tongue and uvula move midline. Shoulder shrug was symmetrical.  Motor exam:   Normal tone, muscle bulk and symmetric strength in all extremities. Good grip strength. Sensory:  Fine touch, pinprick and vibration were  normal. Coordination: Finger-to-nose maneuver  normal without evidence of ataxia, dysmetria or tremor. Gait and station: Patient walks without assistive device. Stance is stable and normal.   Deep tendon reflexes: in the upper and lower extremities are symmetric and intact. Babinski maneuver deferred.    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 Remaining sleepy after weight loss, and after compliant CPAP use, no longer reporting sleep paralysis.  Still needs coffee ( 4 cups or more )  to get through the day.   2) EDS was considered most likely a symptom of OSA , and she was diagnosed with the condition on 06-29-2017, AHI 29.5/h.  Now on compliant CPAP she remains sleepy, and we must check for Narcolepsy again, in spite of the negative HLA test.   3)OSA is still considered largely related to BMI. Snoring since childhood. She lost 60 pounds on Paleo in 2015-16 and she resumed 250 pounds soon after.  Diabetes prevention program through [facility]. She has now been able to reduce to reduce AHI from 44 to 39 kg/m2  .  The patient was advised of the nature of the diagnosed disorder , the treatment options and the  risks for general health and wellness arising from not treating the condition.  I spent more than 25 minutes of face to face time with the patient.Greater than 50% of time was spent in counseling and coordination of care. We have discussed the diagnosis and differential and I answered the patient's questions.    Plan:  Treatment plan and additional workup : she feels unable to undergo MSLT testing, is not able to wean off antidepressant- this terrifies her. Pre divorce and she has been stable , remarried and "doesn't want to risk rocking the boat" .   We can treat with modafinil - OSA/ hypersomnia.   We can also order a MWT- documentation of ability / inability to stay awake.   Continue auto CPAP to a range of  7-12 cm water auto setting , with 3 cm EPR. Keep mask and model. Avoid supine sleep. 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       Larey Seat, MD 35/[id number]/6168, 3:72 PM  Certified in Neurology by ABPN Certified in [place] by [facility] [address], [place], [place] [address]

## 2018-03-08 DIAGNOSIS — F411 Generalized anxiety disorder: Secondary | ICD-10-CM | POA: Diagnosis not present

## 2018-03-08 DIAGNOSIS — G4733 Obstructive sleep apnea (adult) (pediatric): Secondary | ICD-10-CM | POA: Diagnosis not present

## 2018-03-20 ENCOUNTER — Encounter: Payer: Self-pay | Admitting: Neurology

## 2018-03-20 DIAGNOSIS — M5012 Mid-cervical disc disorder, unspecified level: Secondary | ICD-10-CM | POA: Diagnosis not present

## 2018-03-20 DIAGNOSIS — M542 Cervicalgia: Secondary | ICD-10-CM | POA: Diagnosis not present

## 2018-03-20 DIAGNOSIS — M5117 Intervertebral disc disorders with radiculopathy, lumbosacral region: Secondary | ICD-10-CM | POA: Diagnosis not present

## 2018-03-20 DIAGNOSIS — S060X1A Concussion with loss of consciousness of 30 minutes or less, initial encounter: Secondary | ICD-10-CM | POA: Diagnosis not present

## 2018-03-20 DIAGNOSIS — S134XXA Sprain of ligaments of cervical spine, initial encounter: Secondary | ICD-10-CM | POA: Diagnosis not present

## 2018-03-20 DIAGNOSIS — M62838 Other muscle spasm: Secondary | ICD-10-CM | POA: Diagnosis not present

## 2018-03-20 DIAGNOSIS — M62830 Muscle spasm of back: Secondary | ICD-10-CM | POA: Diagnosis not present

## 2018-03-20 DIAGNOSIS — M5412 Radiculopathy, cervical region: Secondary | ICD-10-CM | POA: Diagnosis not present

## 2018-03-26 MED FILL — VIIBRYD 40 MG TABLET: 40 | 90 days supply | Qty: 90 | Fill #3

## 2018-04-10 DIAGNOSIS — M5412 Radiculopathy, cervical region: Secondary | ICD-10-CM | POA: Diagnosis not present

## 2018-04-10 DIAGNOSIS — M5117 Intervertebral disc disorders with radiculopathy, lumbosacral region: Secondary | ICD-10-CM | POA: Diagnosis not present

## 2018-04-10 DIAGNOSIS — S060X1A Concussion with loss of consciousness of 30 minutes or less, initial encounter: Secondary | ICD-10-CM | POA: Diagnosis not present

## 2018-04-10 DIAGNOSIS — M5012 Mid-cervical disc disorder, unspecified level: Secondary | ICD-10-CM | POA: Diagnosis not present

## 2018-04-10 DIAGNOSIS — M62838 Other muscle spasm: Secondary | ICD-10-CM | POA: Diagnosis not present

## 2018-04-10 DIAGNOSIS — M62830 Muscle spasm of back: Secondary | ICD-10-CM | POA: Diagnosis not present

## 2018-04-10 DIAGNOSIS — M542 Cervicalgia: Secondary | ICD-10-CM | POA: Diagnosis not present

## 2018-04-10 DIAGNOSIS — S134XXA Sprain of ligaments of cervical spine, initial encounter: Secondary | ICD-10-CM | POA: Diagnosis not present

## 2018-04-26 DIAGNOSIS — F411 Generalized anxiety disorder: Secondary | ICD-10-CM | POA: Diagnosis not present

## 2018-05-01 DIAGNOSIS — M5117 Intervertebral disc disorders with radiculopathy, lumbosacral region: Secondary | ICD-10-CM | POA: Diagnosis not present

## 2018-05-01 DIAGNOSIS — M542 Cervicalgia: Secondary | ICD-10-CM | POA: Diagnosis not present

## 2018-05-01 DIAGNOSIS — M62830 Muscle spasm of back: Secondary | ICD-10-CM | POA: Diagnosis not present

## 2018-05-01 DIAGNOSIS — M5116 Intervertebral disc disorders with radiculopathy, lumbar region: Secondary | ICD-10-CM | POA: Diagnosis not present

## 2018-05-01 DIAGNOSIS — M62838 Other muscle spasm: Secondary | ICD-10-CM | POA: Diagnosis not present

## 2018-05-01 DIAGNOSIS — M546 Pain in thoracic spine: Secondary | ICD-10-CM | POA: Diagnosis not present

## 2018-05-04 DIAGNOSIS — F411 Generalized anxiety disorder: Secondary | ICD-10-CM | POA: Diagnosis not present

## 2018-05-26 ENCOUNTER — Other Ambulatory Visit: Payer: Self-pay | Admitting: Family Medicine

## 2018-05-27 NOTE — Telephone Encounter (Signed)
Okay one month refill, make WEBEX to check in - haven't seen her in almost a year.

## 2018-05-29 ENCOUNTER — Other Ambulatory Visit: Payer: Self-pay

## 2018-05-29 ENCOUNTER — Encounter: Payer: Self-pay | Admitting: Family Medicine

## 2018-05-29 ENCOUNTER — Ambulatory Visit (INDEPENDENT_AMBULATORY_CARE_PROVIDER_SITE_OTHER): Payer: 59 | Admitting: Family Medicine

## 2018-05-29 VITALS — Temp 98.5°F | Ht 63.75 in | Wt 232.0 lb

## 2018-05-29 DIAGNOSIS — J302 Other seasonal allergic rhinitis: Secondary | ICD-10-CM

## 2018-05-29 DIAGNOSIS — G4733 Obstructive sleep apnea (adult) (pediatric): Secondary | ICD-10-CM | POA: Diagnosis not present

## 2018-05-29 DIAGNOSIS — F3340 Major depressive disorder, recurrent, in remission, unspecified: Secondary | ICD-10-CM | POA: Diagnosis not present

## 2018-05-29 MED ORDER — VILAZODONE HCL 40 MG PO TABS: 40.0000 mg | ORAL_TABLET | Freq: Every day | ORAL | 3 refills | Status: DC

## 2018-05-29 MED FILL — VIIBRYD 40 MG TABLET: 40 | 90 days supply | Qty: 90 | Fill #0

## 2018-05-29 NOTE — Progress Notes (Signed)
Virtual Visit via Video   I connected with Cindy Andrade on 05/29/18 at  7:20 AM EDT by a video enabled telemedicine application and verified that I am speaking with the correct person using two identifiers. Location patient: Home Location provider: [facility], Office Persons participating in the virtual visit: Camella Seim, Briscoe Deutscher, DO Lonell Grandchild, CMA acting as scribe for Dr. Briscoe Deutscher.   I discussed the limitations of evaluation and management by telemedicine and the availability of in person appointments. The patient expressed understanding and agreed to proceed.  Subjective:   HPI: Patient started on cpap in June and has noticed a big in energy and sleep. She has had to start working from home. She has had difficulty with working at home due to [employer]. She is having increased trouble with anxiety at night due to things going on with Covid. She has some issues with it during the day as well. She feels like the anxiety is increasing her depression. She does not feel like she is shutting down a little but not bad. She is struggling with stress eating at this time as well. We have discussed self care ideas and tips.   She was instructed to call if needs additional medications for anxiety.    Reviewed all precautions and expectations with prevention of Covid-19.  ROS: See pertinent positives and negatives per HPI.  Patient Active Problem List   Diagnosis Date Noted  . Excessive daytime sleepiness 06/28/2017  . Hypersomnia with sleep apnea 06/28/2017  . 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 Other parasomnia 06/28/2017  . Morbid obesity with body mass index (BMI) of 40.0 to 49.9 ([place]) 06/28/2017  . Skin lesion of face 05/01/2017  . Status post bilateral salpingectomy 08/17/2015  . Asthma 07/31/2015  . Uterine leiomyoma 07/15/2015  . Anxiety 05/08/2015  . IDA (iron deficiency anemia) 05/08/2015  . Cervical disc disease 02/13/2014  . Thyroid nodule  02/13/2014  . Obstructive sleep apnea syndrome 02/02/2014  . GERD (gastroesophageal reflux disease) 02/02/2014  . Irritable bowel syndrome with both constipation and diarrhea 02/02/2014  . MDD (recurrent major depressive disorder) in remission ([place]) 02/02/2014  . Morbid obesity ([place]) 02/02/2014  . Panic disorder 02/02/2014    Social History   Tobacco Use  . Smoking status: Never Smoker  . Smokeless tobacco: Never Used  Substance Use Topics  . Alcohol use: Yes    Comment: Occasionally   Current Outpatient Medications:  .  dicyclomine (BENTYL) 20 MG tablet, Take 20 mg by mouth as needed. , Disp: , Rfl:  .  ibuprofen (ADVIL,MOTRIN) 800 MG tablet, Take by mouth., Disp: , Rfl:  .  ipratropium (ATROVENT) 0.06 % nasal spray, Place 2 sprays into both nostrils 4 (four) times daily., Disp: 15 mL, Rfl: 0 .  loratadine-pseudoephedrine (CLARITIN-D 12-HOUR) 5-120 MG tablet, Take 1 tablet by mouth daily., Disp: , Rfl:  .  mupirocin ointment (BACTROBAN) 2 %, Place 1 application into the nose 2 (two) times daily., Disp: 22 g, Rfl: 0 .  OVER THE COUNTER MEDICATION, 1 tablet 2 (two) times daily. Magnesium, calcium citrate and vitamin D combo pill., Disp: , Rfl:  .  VIIBRYD 40 MG TABS, TAKE 1 TABLET BY MOUTH DAILY, Disp: 90 tablet, Rfl: 3  Allergies  Allergen Reactions  . Bacitracin Rash  . Neosporin  [Neomycin-Bacitracin Zn-Polymyx] Rash  . Other     Dermabond  . Neomycin Rash  . Tape Rash    Dermabond    Objective:   VITALS:  Per patient if applicable, see vitals. GENERAL: Alert, appears well and in no acute distress. HEENT: Atraumatic, conjunctiva clear, no obvious abnormalities on inspection of external nose and ears. NECK: Normal movements of the head and neck. CARDIOPULMONARY: No increased WOB. Speaking in clear sentences. I:E ratio WNL.  MS: Moves all visible extremities without noticeable abnormality. PSYCH: Pleasant and cooperative, well-groomed. Speech normal rate and rhythm. Affect  is appropriate. Insight and judgement are appropriate. Attention is focused, linear, and appropriate.  NEURO: CN grossly intact. Oriented as arrived to appointment on time with no prompting. Moves both UE equally.  SKIN: No obvious lesions, wounds, erythema, or cyanosis noted on face or hands.  Assessment and Plan:   Amica was seen today for medication refill.  Diagnoses and all orders for this visit:  MDD (recurrent major depressive disorder) in remission [facility]) Comments: Doing well. Refill. Continue. Orders: -     Vilazodone HCl (VIIBRYD) 40 MG TABS; Take 1 tablet (40 mg total) by mouth daily.  Morbid obesity with body mass index (BMI) of 40.0 to 49.9 [facility]) Comments: Discussed getting outside for exercise.  Obstructive sleep apnea syndrome Comments: Using CPAP.  Seasonal allergies Comments: Encouraged to restart antihistamine.   . Reviewed expectations re: course of current medical issues. . Discussed self-management of symptoms. . Outlined signs and symptoms indicating need for more acute intervention. . Patient verbalized understanding and all questions were answered. Marland Kitchen Health Maintenance issues including appropriate healthy diet, exercise, and smoking avoidance were discussed with patient. . See orders for this visit as documented in the electronic medical record.  Briscoe Deutscher, DO 05/29/2018

## 2018-06-01 DIAGNOSIS — F411 Generalized anxiety disorder: Secondary | ICD-10-CM | POA: Diagnosis not present

## 2018-06-08 DIAGNOSIS — F411 Generalized anxiety disorder: Secondary | ICD-10-CM | POA: Diagnosis not present

## 2018-06-29 DIAGNOSIS — F411 Generalized anxiety disorder: Secondary | ICD-10-CM | POA: Diagnosis not present

## 2018-07-05 DIAGNOSIS — M542 Cervicalgia: Secondary | ICD-10-CM | POA: Diagnosis not present

## 2018-07-05 DIAGNOSIS — M546 Pain in thoracic spine: Secondary | ICD-10-CM | POA: Diagnosis not present

## 2018-07-05 DIAGNOSIS — M62830 Muscle spasm of back: Secondary | ICD-10-CM | POA: Diagnosis not present

## 2018-07-05 DIAGNOSIS — M5116 Intervertebral disc disorders with radiculopathy, lumbar region: Secondary | ICD-10-CM | POA: Diagnosis not present

## 2018-07-13 DIAGNOSIS — F411 Generalized anxiety disorder: Secondary | ICD-10-CM | POA: Diagnosis not present

## 2018-08-10 DIAGNOSIS — F411 Generalized anxiety disorder: Secondary | ICD-10-CM | POA: Diagnosis not present

## 2018-08-13 ENCOUNTER — Encounter: Payer: Self-pay | Admitting: Family Medicine

## 2018-08-17 ENCOUNTER — Ambulatory Visit (INDEPENDENT_AMBULATORY_CARE_PROVIDER_SITE_OTHER): Payer: 59 | Admitting: Family Medicine

## 2018-08-17 ENCOUNTER — Other Ambulatory Visit: Payer: Self-pay

## 2018-08-17 ENCOUNTER — Encounter: Payer: Self-pay | Admitting: Family Medicine

## 2018-08-17 VITALS — Ht 63.75 in | Wt 232.0 lb

## 2018-08-17 DIAGNOSIS — G4733 Obstructive sleep apnea (adult) (pediatric): Secondary | ICD-10-CM

## 2018-08-17 DIAGNOSIS — F5104 Psychophysiologic insomnia: Secondary | ICD-10-CM | POA: Insufficient documentation

## 2018-08-17 MED ORDER — TRAZODONE HCL 50 MG PO TABS: 25.0000 mg | ORAL_TABLET | Freq: Every evening | ORAL | 3 refills | Status: DC | PRN

## 2018-08-17 MED FILL — traZODone HCL 50 MG TABS: 50 | 30 days supply | Qty: 30 | Fill #0

## 2018-08-17 NOTE — Progress Notes (Signed)
Virtual Visit via Video   Due to the COVID-19 pandemic, this visit was completed with telemedicine (audio/video) technology to reduce patient and provider exposure as well as to preserve personal protective equipment.   I connected with Cindy Andrade by a video enabled telemedicine application and verified that I am speaking with the correct person using two identifiers. Location patient: Home Location provider: [facility], Office Persons participating in the virtual visit: Cindy Andrade, Cindy Deutscher, DO Cindy Andrade, CMA acting as scribe for Dr. Briscoe Andrade.   I discussed the limitations of evaluation and management by telemedicine and the availability of in person appointments. The patient expressed understanding and agreed to proceed.  Care Team   Patient Care Team: Cindy Deutscher, DO as PCP - General (Family Medicine)  Subjective:   HPI: "I want to see if you can prescribe something to help me sleep. I am using my CPAP for at least 4 hours most nights. My problem seems to be getting my brain to turn off. Sometimes this is when I am first trying to go to sleep and sometimes it happens if I wake up in the middle of the night. I can't get back to sleep. I do think it's tied to anxiety. Another part of the challenge is when I wake up in the middle of the night I will take off my CPAP because it's aggravating. Whereas if I'm able to sleep thru then I get more time using it."  She is currently taking Vibryd and has been helpful. She feels like she is having more issues with anxiety. In the past she has been on lorazepam with no issues and was stepped down to clonazepam. If started on something she would like to start back on something that is not going to increase sleep apnea     GAD 7 : Generalized Anxiety Score 08/17/2018  Nervous, Anxious, on Edge 1  Control/stop worrying 1  Worry too much - different things 1  Trouble relaxing 1  Restless 0  Easily annoyed or irritable  1  Afraid - awful might happen 0  Total GAD 7 Score 5  Anxiety Difficulty Somewhat difficult    Depression screen [facility] 2/9 08/17/2018 05/29/2018 05/01/2017  Decreased Interest 1 0 0  Down, Depressed, Hopeless 1 1 0  PHQ - 2 Score 2 1 0  Altered sleeping 2 1 1   Tired, decreased energy 1 0 3  Change in appetite 3 0 1  Feeling bad or failure about yourself  0 0 0  Trouble concentrating 1 0 0  Moving slowly or fidgety/restless 0 0 0  Suicidal thoughts 0 0 0  PHQ-9 Score 9 2 5   Difficult doing work/chores Somewhat difficult Not difficult at all Somewhat difficult   Review of Systems  Constitutional: Positive for malaise/fatigue. Negative for chills, fever and weight loss.  Respiratory: Negative for cough, shortness of breath and wheezing.   Cardiovascular: Negative for chest pain, palpitations and leg swelling.  Gastrointestinal: Negative for abdominal pain, constipation, diarrhea, nausea and vomiting.  Genitourinary: Negative for dysuria and urgency.  Musculoskeletal: Negative for joint pain and myalgias.  Skin: Negative for rash.  Neurological: Negative for dizziness and headaches.  Psychiatric/Behavioral: Negative for depression, substance abuse and suicidal ideas. The patient is nervous/anxious and has insomnia.     Patient Active Problem List   Diagnosis Date Noted  . Psychophysiological insomnia 08/17/2018  . Excessive daytime sleepiness 06/28/2017  . Hypersomnia with sleep apnea 06/28/2017  . Recurrent isolated sleep paralysis  06/28/2017  . Other parasomnia 06/28/2017  . Morbid obesity with body mass index (BMI) of 40.0 to 49.9 ([place]) 06/28/2017  . Skin lesion of face 05/01/2017  . Status post bilateral salpingectomy 08/17/2015  . Asthma 07/31/2015  . Uterine leiomyoma 07/15/2015  . Anxiety 05/08/2015  . IDA (iron deficiency anemia) 05/08/2015  . Cervical disc disease 02/13/2014  . Thyroid nodule 02/13/2014  . Obstructive sleep apnea syndrome 02/02/2014  . GERD  (gastroesophageal reflux disease) 02/02/2014  . Irritable bowel syndrome with both constipation and diarrhea 02/02/2014  . MDD (recurrent major depressive disorder) in remission ([place]) 02/02/2014  . Morbid obesity ([place]) 02/02/2014  . Panic disorder 02/02/2014    Social History   Tobacco Use  . Smoking status: Never Smoker  . Smokeless tobacco: Never Used  Substance Use Topics  . Alcohol use: Yes    Comment: Occasionally   Current Outpatient Medications:  .  dicyclomine (BENTYL) 20 MG tablet, Take 20 mg by mouth as needed. , Disp: , Rfl:  .  ipratropium (ATROVENT) 0.06 % nasal spray, Place 2 sprays into both nostrils 4 (four) times daily., Disp: 15 mL, Rfl: 0 .  loratadine-pseudoephedrine (CLARITIN-D 12-HOUR) 5-120 MG tablet, Take 1 tablet by mouth daily., Disp: , Rfl:  .  mupirocin ointment (BACTROBAN) 2 %, Place 1 application into the nose 2 (two) times daily., Disp: 22 g, Rfl: 0 .  Vilazodone HCl (VIIBRYD) 40 MG TABS, Take 1 tablet (40 mg total) by mouth daily., Disp: 90 tablet, Rfl: 3  Allergies  Allergen Reactions  . Bacitracin Rash  . Other     Dermabond  . Neomycin Rash   Objective:   VITALS: Per patient if applicable, see vitals. GENERAL: Alert, appears well and in no acute distress. HEENT: Atraumatic, conjunctiva clear, no obvious abnormalities on inspection of external nose and ears. NECK: Normal movements of the head and neck. CARDIOPULMONARY: No increased WOB. Speaking in clear sentences. I:E ratio WNL.  MS: Moves all visible extremities without noticeable abnormality. PSYCH: Pleasant and cooperative, well-groomed. Speech normal rate and rhythm. Affect is appropriate. Insight and judgement are appropriate. Attention is focused, linear, and appropriate.  NEURO: CN grossly intact. Oriented as arrived to appointment on time with no prompting. Moves both UE equally.  SKIN: No obvious lesions, wounds, erythema, or cyanosis noted on face or hands.  Assessment and Plan:    [name] was seen today for insomnia.  Diagnoses and all orders for this visit:  Obstructive sleep apnea syndrome  Psychophysiological insomnia -     traZODone (DESYREL) 50 MG tablet; Take 0.5-1 tablets (25-50 mg total) by mouth at bedtime as needed for sleep.   Marland Kitchen COVID-19 Education: The signs and symptoms of COVID-19 were discussed with the patient and how to seek care for testing if needed. The importance of social distancing was discussed today. . Reviewed expectations re: course of current medical issues. . Discussed self-management of symptoms. . Outlined signs and symptoms indicating need for more acute intervention. . Patient verbalized understanding and all questions were answered. Marland Kitchen Health Maintenance issues including appropriate healthy diet, exercise, and smoking avoidance were discussed with patient. . See orders for this visit as documented in the electronic medical record.  Cindy Deutscher, DO  Records requested if needed. Time spent: 25 minutes, of which >50% was spent in obtaining information about her symptoms, reviewing her previous labs, evaluations, and treatments, counseling her about her condition (please see the discussed topics above), and developing a plan to further investigate it; she  had a number of questions which I addressed.

## 2018-08-21 MED FILL — VIIBRYD 40 MG TABLET: 40 | 90 days supply | Qty: 90 | Fill #1

## 2018-08-22 ENCOUNTER — Encounter: Payer: Self-pay | Admitting: Family Medicine

## 2018-08-29 DIAGNOSIS — F411 Generalized anxiety disorder: Secondary | ICD-10-CM | POA: Diagnosis not present

## 2018-09-12 DIAGNOSIS — F411 Generalized anxiety disorder: Secondary | ICD-10-CM | POA: Diagnosis not present

## 2018-09-13 DIAGNOSIS — M5116 Intervertebral disc disorders with radiculopathy, lumbar region: Secondary | ICD-10-CM | POA: Diagnosis not present

## 2018-09-13 DIAGNOSIS — M62830 Muscle spasm of back: Secondary | ICD-10-CM | POA: Diagnosis not present

## 2018-09-13 DIAGNOSIS — M546 Pain in thoracic spine: Secondary | ICD-10-CM | POA: Diagnosis not present

## 2018-09-13 DIAGNOSIS — M542 Cervicalgia: Secondary | ICD-10-CM | POA: Diagnosis not present

## 2018-09-28 ENCOUNTER — Other Ambulatory Visit: Payer: Self-pay

## 2018-09-28 ENCOUNTER — Other Ambulatory Visit (HOSPITAL_COMMUNITY)
Admission: RE | Admit: 2018-09-28 | Discharge: 2018-09-28 | Disposition: A | Discharge status: Home / Self Care | Payer: 59 | Source: Ambulatory Visit | Attending: Family Medicine | Admitting: Family Medicine

## 2018-09-28 ENCOUNTER — Ambulatory Visit (INDEPENDENT_AMBULATORY_CARE_PROVIDER_SITE_OTHER): Payer: 59 | Admitting: Family Medicine

## 2018-09-28 ENCOUNTER — Encounter: Payer: Self-pay | Admitting: Family Medicine

## 2018-09-28 VITALS — BP 122/60 | HR 102 | Temp 98.7°F | Ht 63.75 in | Wt 246.6 lb

## 2018-09-28 DIAGNOSIS — Z124 Encounter for screening for malignant neoplasm of cervix: Secondary | ICD-10-CM | POA: Insufficient documentation

## 2018-09-28 DIAGNOSIS — E041 Nontoxic single thyroid nodule: Secondary | ICD-10-CM | POA: Diagnosis not present

## 2018-09-28 DIAGNOSIS — G4733 Obstructive sleep apnea (adult) (pediatric): Secondary | ICD-10-CM

## 2018-09-28 DIAGNOSIS — Z0000 Encounter for general adult medical examination without abnormal findings: Secondary | ICD-10-CM

## 2018-09-28 DIAGNOSIS — D509 Iron deficiency anemia, unspecified: Secondary | ICD-10-CM

## 2018-09-28 DIAGNOSIS — G8929 Other chronic pain: Secondary | ICD-10-CM

## 2018-09-28 DIAGNOSIS — M25571 Pain in right ankle and joints of right foot: Secondary | ICD-10-CM | POA: Diagnosis not present

## 2018-09-28 DIAGNOSIS — R7301 Impaired fasting glucose: Secondary | ICD-10-CM | POA: Diagnosis not present

## 2018-09-28 DIAGNOSIS — F419 Anxiety disorder, unspecified: Secondary | ICD-10-CM

## 2018-09-28 DIAGNOSIS — Z809 Family history of malignant neoplasm, unspecified: Secondary | ICD-10-CM

## 2018-09-28 DIAGNOSIS — Z13220 Encounter for screening for lipoid disorders: Secondary | ICD-10-CM

## 2018-09-28 LAB — CBC WITH DIFFERENTIAL/PLATELET
Basophils Absolute: 0 10*3/uL (ref 0.0–0.1)
Basophils Relative: 0.5 % (ref 0.0–3.0)
Eosinophils Absolute: 0.1 10*3/uL (ref 0.0–0.7)
Eosinophils Relative: 1.1 % (ref 0.0–5.0)
HCT: 38.6 % (ref 36.0–46.0)
Hemoglobin: 12.6 g/dL (ref 12.0–15.0)
Lymphocytes Relative: 27.5 % (ref 12.0–46.0)
Lymphs Abs: 2.3 10*3/uL (ref 0.7–4.0)
MCHC: 32.8 g/dL (ref 30.0–36.0)
MCV: 79.6 fl (ref 78.0–100.0)
Monocytes Absolute: 0.4 10*3/uL (ref 0.1–1.0)
Monocytes Relative: 4.6 % (ref 3.0–12.0)
Neutro Abs: 5.5 10*3/uL (ref 1.4–7.7)
Neutrophils Relative %: 66.3 % (ref 43.0–77.0)
Platelets: 318 10*3/uL (ref 150.0–400.0)
RBC: 4.84 Mil/uL (ref 3.87–5.11)
RDW: 17.7 % — ABNORMAL HIGH (ref 11.5–15.5)
WBC: 8.3 10*3/uL (ref 4.0–10.5)

## 2018-09-28 LAB — COMPREHENSIVE METABOLIC PANEL
ALT: 18 U/L (ref 0–35)
AST: 17 U/L (ref 0–37)
Albumin: 4.2 g/dL (ref 3.5–5.2)
Alkaline Phosphatase: 59 U/L (ref 39–117)
BUN: 15 mg/dL (ref 6–23)
CO2: 26 mEq/L (ref 19–32)
Calcium: 9.6 mg/dL (ref 8.4–10.5)
Chloride: 102 mEq/L (ref 96–112)
Creatinine, Ser: 0.71 mg/dL (ref 0.40–1.20)
GFR: 89.91 mL/min (ref >60.00)
Glucose, Bld: 94 mg/dL (ref 70–99)
Potassium: 3.8 mEq/L (ref 3.5–5.1)
Sodium: 138 mEq/L (ref 135–145)
Total Bilirubin: 0.4 mg/dL (ref 0.2–1.2)
Total Protein: 7.3 g/dL (ref 6.0–8.3)

## 2018-09-28 LAB — TSH: TSH: 0.96 u[IU]/mL (ref 0.35–4.50)

## 2018-09-28 LAB — LIPID PANEL
Cholesterol: 154 mg/dL (ref 0–200)
HDL: 82 mg/dL (ref >39.00)
LDL Cholesterol: 56 mg/dL (ref 0–99)
NonHDL: 71.85
Total CHOL/HDL Ratio: 2
Triglycerides: 77 mg/dL (ref 0.0–149.0)
VLDL: 15.4 mg/dL (ref 0.0–40.0)

## 2018-09-28 LAB — HEMOGLOBIN A1C: Hgb A1c MFr Bld: 5.5 % (ref 4.6–6.5)

## 2018-09-28 MED ORDER — CLONAZEPAM 0.5 MG PO TABS: 0.5000 mg | ORAL_TABLET | Freq: Two times a day (BID) | ORAL | 2 refills | Status: DC | PRN

## 2018-09-28 MED FILL — clonazePAM 0.5 MG TABS: 0.5 | 15 days supply | Qty: 30 | Fill #0

## 2018-09-28 NOTE — Progress Notes (Signed)
Subjective:    Cindy Andrade is a 43 y.o. female and is here for a comprehensive physical exam.  Anxiety. Intermittent. With some panic attacks. Klonopin has worked well in the past. Trazodone for sleep caused too much morning sedation.  Chronic right ankle pain, wants referral to Orthopedics.  Mom and grandmother had breast cancer.  Doing weight watchers.  Health Maintenance Due  Topic Date Due  . INFLUENZA VACCINE  09/29/2018     Depression screen [facility] 2/9 09/28/2018 08/17/2018 05/29/2018 05/01/2017  Decreased Interest 0 1 0 0  Down, Depressed, Hopeless 1 1 1  0  PHQ - 2 Score 1 2 1  0  Altered sleeping 1 2 1 1   Tired, decreased energy 1 1 0 3  Change in appetite 2 3 0 1  Feeling bad or failure about yourself  0 0 0 0  Trouble concentrating 0 1 0 0  Moving slowly or fidgety/restless 0 0 0 0  Suicidal thoughts 0 0 0 0  PHQ-9 Score 5 9 2 5   Difficult doing work/chores Somewhat difficult Somewhat difficult Not difficult at all Somewhat difficult    Current Outpatient Medications:  .  dicyclomine (BENTYL) 20 MG tablet, Take 20 mg by mouth as needed. , Disp: , Rfl:  .  ipratropium (ATROVENT) 0.06 % nasal spray, Place 2 sprays into both nostrils 4 (four) times daily., Disp: 15 mL, Rfl: 0 .  loratadine-pseudoephedrine (CLARITIN-D 12-HOUR) 5-120 MG tablet, Take 1 tablet by mouth daily., Disp: , Rfl:  .  mupirocin ointment (BACTROBAN) 2 %, Place 1 application into the nose 2 (two) times daily., Disp: 22 g, Rfl: 0 .  Vilazodone HCl (VIIBRYD) 40 MG TABS, Take 1 tablet (40 mg total) by mouth daily., Disp: 90 tablet, Rfl: 3 .  clonazePAM (KLONOPIN) 0.5 MG tablet, Take 1 tablet (0.5 mg total) by mouth 2 (two) times daily as needed for anxiety., Disp: 30 tablet, Rfl: 2  PMHx, SurgHx, SocialHx, Medications, and Allergies were reviewed in the Visit Navigator and updated as appropriate.   Past Medical History:  Diagnosis Date  . Anxiety   . Arthritis   . Asthma   . Chicken pox   .  Depression   . Hay fever   . Irritable bowel syndrome with both constipation and diarrhea 02/02/2014  . Obstructive sleep apnea syndrome 02/02/2014     Past Surgical History:  Procedure Laterality Date  . ANTERIOR CRUCIATE LIGAMENT REPAIR    . CHOLECYSTECTOMY  2012  . EYE SURGERY Bilateral   . SALPINGECTOMY       Family History  Problem Relation Age of Onset  . Arthritis Mother   . Cancer Mother   . Cancer Father   . Depression Father   . Diabetes Father   . Hearing loss Father   . High Cholesterol Father   . Kidney disease Father   . Stroke Father   . Sleep apnea Father   . Arthritis Maternal Grandmother   . Cancer Maternal Grandmother   . Depression Maternal Grandmother   . Birth defects Paternal Grandmother   . High Cholesterol Paternal Grandmother   . Heart attack Paternal Grandfather   . Heart disease Paternal Grandfather   . High Cholesterol Paternal Grandfather   . Hypertension Paternal Grandfather     Social History   Tobacco Use  . Smoking status: Never Smoker  . Smokeless tobacco: Never Used  Substance Use Topics  . Alcohol use: Yes    Comment: Occasionally  . Drug use: Never  Review of Systems:   Pertinent items are noted in the HPI. Otherwise, ROS is negative.  Objective:   BP 122/60   Pulse (!) 102   Temp 98.7 F (37.1 C) (Oral)   Ht 5' 3.75" (1.619 m)   Wt 246 lb 9.6 oz (111.9 kg)   LMP 08/28/2018   SpO2 97%   BMI 42.66 kg/m   General appearance: alert, cooperative and appears stated age. Head: normocephalic, without obvious abnormality, atraumatic. Neck: no adenopathy, supple, symmetrical, trachea midline; thyroid not enlarged, symmetric, no tenderness/mass/nodules. Lungs: clear to auscultation bilaterally. Breasts: inspection negative, no nipple retraction or dimpling, no nipple discharge or bleeding, no axillary or supraclavicular adenopathy, normal to palpation without dominant masses. Heart: regular rate and rhythm Abdomen:  soft, non-tender; no masses,  no organomegaly. Extremities: extremities normal, atraumatic, no cyanosis or edema. Skin: skin color, texture, turgor normal, no rashes or lesions. Lymph: cervical, supraclavicular, and axillary nodes normal; no abnormal inguinal nodes palpated. Neurologic: grossly normal.  Pelvic:  External genitalia: no lesions. Urethra: normal appearing urethra with no masses, tenderness or lesions. Bartholin's and Skene's: normal. Vagina: normal appearing vagina with normal color and discharge, no lesions. Cervix: normal appearance. Pap and high risk HPV testing done: Yes.   Uterus: uterus is normal size, shape, consistency and nontender. Adnexa: normal adnexa in size, nontender and no masses.                                      Assessment/Plan:   Cindy Andrade was seen today for annual exam.  Diagnoses and all orders for this visit:  Routine physical examination  Screening for cervical cancer -     Cytology - PAP  Family history of cancer -     Ambulatory referral to Genetics  Chronic pain of right ankle -     Ambulatory referral to Orthopedics  Iron deficiency anemia, unspecified iron deficiency anemia type -     CBC with Differential/Platelet -     Iron, TIBC and Ferritin Panel  Obstructive sleep apnea syndrome  Morbid obesity with body mass index (BMI) of 40.0 to 49.9 (HCC)  Thyroid nodule -     TSH  Anxiety -     clonazePAM (KLONOPIN) 0.5 MG tablet; Take 1 tablet (0.5 mg total) by mouth 2 (two) times daily as needed for anxiety.  Screening for lipid disorders -     Lipid panel  Fasting hyperglycemia -     Comprehensive metabolic panel -     Hemoglobin A1c    Patient Counseling: [x]    Nutrition: Stressed importance of moderation in sodium/caffeine intake, saturated fat and cholesterol, caloric balance, sufficient intake of fresh fruits, vegetables, fiber, calcium, iron, and 1 mg of folate supplement per day (for females capable of pregnancy).  [x]     Stressed the importance of regular exercise.   [x]    Substance Abuse: Discussed cessation/primary prevention of tobacco, alcohol, or other drug use; driving or other dangerous activities under the influence; availability of treatment for abuse.   [x]    Injury prevention: Discussed safety belts, safety helmets, smoke detector, smoking near bedding or upholstery.   [x]    Sexuality: Discussed sexually transmitted diseases, partner selection, use of condoms, avoidance of unintended pregnancy  and contraceptive alternatives.  [x]    Dental health: Discussed importance of regular tooth brushing, flossing, and dental visits.  [x]    Health maintenance and immunizations reviewed. Please refer to  Health maintenance section.   Briscoe Deutscher, DO [place]

## 2018-09-29 LAB — IRON,TIBC AND FERRITIN PANEL
%SAT: 8 % (calc) — ABNORMAL LOW (ref 16–45)
Ferritin: 7 ng/mL — ABNORMAL LOW (ref 16–232)
Iron: 36 ug/dL — ABNORMAL LOW (ref 40–190)
TIBC: 450 mcg/dL (calc) (ref 250–450)

## 2018-09-30 ENCOUNTER — Encounter: Payer: Self-pay | Admitting: Family Medicine

## 2018-10-01 LAB — CYTOLOGY - PAP
Adequacy: ABSENT
Chlamydia: NEGATIVE
Diagnosis: NEGATIVE
HPV: NOT DETECTED
Neisseria Gonorrhea: NEGATIVE
Trichomonas: NEGATIVE

## 2018-10-02 ENCOUNTER — Encounter: Payer: Self-pay | Admitting: Family Medicine

## 2018-10-05 DIAGNOSIS — F411 Generalized anxiety disorder: Secondary | ICD-10-CM | POA: Diagnosis not present

## 2018-10-11 DIAGNOSIS — M546 Pain in thoracic spine: Secondary | ICD-10-CM | POA: Diagnosis not present

## 2018-10-11 DIAGNOSIS — M5116 Intervertebral disc disorders with radiculopathy, lumbar region: Secondary | ICD-10-CM | POA: Diagnosis not present

## 2018-10-11 DIAGNOSIS — M542 Cervicalgia: Secondary | ICD-10-CM | POA: Diagnosis not present

## 2018-10-11 DIAGNOSIS — M62830 Muscle spasm of back: Secondary | ICD-10-CM | POA: Diagnosis not present

## 2018-10-22 DIAGNOSIS — M62830 Muscle spasm of back: Secondary | ICD-10-CM | POA: Diagnosis not present

## 2018-10-22 DIAGNOSIS — M542 Cervicalgia: Secondary | ICD-10-CM | POA: Diagnosis not present

## 2018-10-22 DIAGNOSIS — M546 Pain in thoracic spine: Secondary | ICD-10-CM | POA: Diagnosis not present

## 2018-10-22 DIAGNOSIS — M5116 Intervertebral disc disorders with radiculopathy, lumbar region: Secondary | ICD-10-CM | POA: Diagnosis not present

## 2018-10-24 DIAGNOSIS — M25571 Pain in right ankle and joints of right foot: Secondary | ICD-10-CM | POA: Diagnosis not present

## 2018-10-24 DIAGNOSIS — F411 Generalized anxiety disorder: Secondary | ICD-10-CM | POA: Diagnosis not present

## 2018-10-24 DIAGNOSIS — M25371 Other instability, right ankle: Secondary | ICD-10-CM | POA: Diagnosis not present

## 2018-11-07 DIAGNOSIS — M546 Pain in thoracic spine: Secondary | ICD-10-CM | POA: Diagnosis not present

## 2018-11-07 DIAGNOSIS — M62830 Muscle spasm of back: Secondary | ICD-10-CM | POA: Diagnosis not present

## 2018-11-07 DIAGNOSIS — M542 Cervicalgia: Secondary | ICD-10-CM | POA: Diagnosis not present

## 2018-11-07 DIAGNOSIS — M5116 Intervertebral disc disorders with radiculopathy, lumbar region: Secondary | ICD-10-CM | POA: Diagnosis not present

## 2018-11-09 ENCOUNTER — Encounter: Payer: Self-pay | Admitting: Family Medicine

## 2018-11-11 DIAGNOSIS — G8929 Other chronic pain: Secondary | ICD-10-CM | POA: Insufficient documentation

## 2018-11-11 DIAGNOSIS — M25371 Other instability, right ankle: Secondary | ICD-10-CM | POA: Insufficient documentation

## 2018-11-13 ENCOUNTER — Other Ambulatory Visit: Payer: Self-pay

## 2018-11-13 ENCOUNTER — Encounter: Payer: Self-pay | Admitting: Family Medicine

## 2018-11-13 ENCOUNTER — Ambulatory Visit (INDEPENDENT_AMBULATORY_CARE_PROVIDER_SITE_OTHER): Payer: 59 | Admitting: Family Medicine

## 2018-11-13 VITALS — Ht 63.75 in | Wt 250.0 lb

## 2018-11-13 DIAGNOSIS — R790 Abnormal level of blood mineral: Secondary | ICD-10-CM | POA: Diagnosis not present

## 2018-11-13 DIAGNOSIS — M25571 Pain in right ankle and joints of right foot: Secondary | ICD-10-CM

## 2018-11-13 DIAGNOSIS — R4586 Emotional lability: Secondary | ICD-10-CM

## 2018-11-13 DIAGNOSIS — G8929 Other chronic pain: Secondary | ICD-10-CM | POA: Diagnosis not present

## 2018-11-13 DIAGNOSIS — F419 Anxiety disorder, unspecified: Secondary | ICD-10-CM

## 2018-11-13 DIAGNOSIS — Z86018 Personal history of other benign neoplasm: Secondary | ICD-10-CM | POA: Diagnosis not present

## 2018-11-13 NOTE — Progress Notes (Signed)
Virtual Visit via Video   Due to the COVID-19 pandemic, this visit was completed with telemedicine (audio/video) technology to reduce patient and provider exposure as well as to preserve personal protective equipment.   I connected with Cindy Andrade by a video enabled telemedicine application and verified that I am speaking with the correct person using two identifiers. Location patient: Home Location provider: [facility], Office Persons participating in the virtual visit: Jalisia, Knake, DO   I discussed the limitations of evaluation and management by telemedicine and the availability of in person appointments. The patient expressed understanding and agreed to proceed.  Care Team   Patient Care Team: Briscoe Deutscher, DO as PCP - General (Family Medicine) Nicholes Stairs, MD as Consulting Physician (Orthopedic Surgery)  Subjective:   HPI:   1. Ankle pain, chronic. Will be seeing specialist. Needs PT referral for [facility] facility.  2. Sleeping better, using Klonopin 1 mg po q hs prn.  3. Had an issue at work. "Messed up an audit" and was counseled. Happy that she did not lose her job but mortified and struggling with this.  4. Emotional lability. Feels that she may be having perimenopausal changes, much more emotional that she is usually. Crying. No anger.   5. Menses are irregular, heavy, with intermenstrual spotting. Known fibroids diagnosed a few years ago. Hx of breast cancer, so stopped OCPs. Hx of low ferritin recently. Not taking iron supplement.   Review of Systems  Constitutional: Negative for chills.  Respiratory: Negative for cough.   Cardiovascular: Negative for chest pain and palpitations.  Gastrointestinal: Negative for nausea and vomiting.  Genitourinary: Negative for dysuria.  Neurological: Negative for dizziness and headaches.  Psychiatric/Behavioral: Positive for depression. Negative for hallucinations, memory loss, substance abuse and  suicidal ideas. The patient is nervous/anxious and has insomnia.     Patient Active Problem List   Diagnosis Date Noted  . Low ferritin 11/13/2018  . History of uterine fibroid 11/13/2018  . Labile mood 11/13/2018  . Chronic pain of right ankle 11/11/2018  . Unstable right ankle 11/11/2018  . Psychophysiological insomnia 08/17/2018  . Excessive daytime sleepiness 06/28/2017  . Hypersomnia with sleep apnea 06/28/2017  . 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 Other parasomnia 06/28/2017  . Morbid obesity with body mass index (BMI) of 40.0 to 49.9 ([place]) 06/28/2017  . Skin lesion of face 05/01/2017  . Status post bilateral salpingectomy 08/17/2015  . Asthma 07/31/2015  . Uterine leiomyoma 07/15/2015  . Anxiety 05/08/2015  . IDA (iron deficiency anemia) 05/08/2015  . Cervical disc disease 02/13/2014  . Thyroid nodule 02/13/2014  . Obstructive sleep apnea syndrome 02/02/2014  . GERD (gastroesophageal reflux disease) 02/02/2014  . Irritable bowel syndrome with both constipation and diarrhea 02/02/2014  . MDD (recurrent major depressive disorder) in remission ([place]) 02/02/2014  . Morbid obesity ([place]) 02/02/2014  . Panic disorder 02/02/2014    Social History   Tobacco Use  . Smoking status: Never Smoker  . Smokeless tobacco: Never Used  Substance Use Topics  . Alcohol use: Yes    Comment: Occasionally   Current Outpatient Medications:  .  clonazePAM (KLONOPIN) 0.5 MG tablet, Take 1 tablet (0.5 mg total) by mouth 2 (two) times daily as needed for anxiety., Disp: 30 tablet, Rfl: 2 .  dicyclomine (BENTYL) 20 MG tablet, Take 20 mg by mouth as needed. , Disp: , Rfl:  .  ipratropium (ATROVENT) 0.06 % nasal spray, Place 2 sprays into both nostrils 4 (four) times  daily., Disp: 15 mL, Rfl: 0 .  loratadine-pseudoephedrine (CLARITIN-D 12-HOUR) 5-120 MG tablet, Take 1 tablet by mouth daily., Disp: , Rfl:  .  Vilazodone HCl (VIIBRYD) 40 MG TABS, Take 1 tablet (40 mg total) by mouth  daily., Disp: 90 tablet, Rfl: 3  Allergies  Allergen Reactions  . Bacitracin Rash  . Other     Dermabond  . Neomycin Rash    Objective:   VITALS: Per patient if applicable, see vitals. GENERAL: Alert, appears well and in no acute distress. HEENT: Atraumatic, conjunctiva clear, no obvious abnormalities on inspection of external nose and ears. NECK: Normal movements of the head and neck. CARDIOPULMONARY: No increased WOB. Speaking in clear sentences. I:E ratio WNL.  MS: Moves all visible extremities without noticeable abnormality. PSYCH: Pleasant and cooperative, well-groomed. Speech normal rate and rhythm. Affect is appropriate. Insight and judgement are appropriate. Attention is focused, linear, and appropriate.  NEURO: CN grossly intact. Oriented as arrived to appointment on time with no prompting. Moves both UE equally.  SKIN: No obvious lesions, wounds, erythema, or cyanosis noted on face or hands.  Depression screen [facility] 2/9 11/13/2018 09/28/2018 08/17/2018  Decreased Interest 1 0 1  Down, Depressed, Hopeless 1 1 1   PHQ - 2 Score 2 1 2   Altered sleeping 0 1 2  Tired, decreased energy 1 1 1   Change in appetite 3 2 3   Feeling bad or failure about yourself  1 0 0  Trouble concentrating 1 0 1  Moving slowly or fidgety/restless 0 0 0  Suicidal thoughts 0 0 0  PHQ-9 Score 8 5 9   Difficult doing work/chores Somewhat difficult Somewhat difficult Somewhat difficult    Assessment and Plan:   [name] was seen today for follow-up.  Diagnoses and all orders for this visit:  Chronic pain of right ankle -     Ambulatory referral to Physical Therapy  Anxiety  Labile mood Comments: Will see if GYN thinks that this is related to perimenopause.  Orders: -     Ambulatory referral to Gynecology  History of uterine fibroid -     Ambulatory referral to Gynecology  Low ferritin -     Ambulatory referral to Gynecology   . COVID-19 Education: The signs and symptoms of COVID-19 were  discussed with the patient and how to seek care for testing if needed. The importance of social distancing was discussed today. . Reviewed expectations re: course of current medical issues. . Discussed self-management of symptoms. . Outlined signs and symptoms indicating need for more acute intervention. . Patient verbalized understanding and all questions were answered. Marland Kitchen Health Maintenance issues including appropriate healthy diet, exercise, and smoking avoidance were discussed with patient. . See orders for this visit as documented in the electronic medical record.  Briscoe Deutscher, DO  Records requested if needed. Time spent: 25 minutes, of which >50% was spent in obtaining information about her symptoms, reviewing her previous labs, evaluations, and treatments, counseling her about her condition (please see the discussed topics above), and developing a plan to further investigate it; she had a number of questions which I addressed.

## 2018-11-15 ENCOUNTER — Encounter: Payer: Self-pay | Admitting: Obstetrics and Gynecology

## 2018-11-19 MED FILL — VIIBRYD 40 MG TABLET: 40 | 90 days supply | Qty: 90 | Fill #2

## 2018-11-21 ENCOUNTER — Ambulatory Visit: Payer: 59

## 2018-11-23 ENCOUNTER — Other Ambulatory Visit: Payer: Self-pay

## 2018-11-23 DIAGNOSIS — G4733 Obstructive sleep apnea (adult) (pediatric): Secondary | ICD-10-CM | POA: Diagnosis not present

## 2018-11-27 ENCOUNTER — Encounter: Payer: Self-pay | Admitting: Obstetrics and Gynecology

## 2018-11-27 ENCOUNTER — Other Ambulatory Visit: Payer: Self-pay

## 2018-11-27 ENCOUNTER — Encounter: Payer: Self-pay | Admitting: Family Medicine

## 2018-11-27 ENCOUNTER — Ambulatory Visit (INDEPENDENT_AMBULATORY_CARE_PROVIDER_SITE_OTHER): Payer: 59 | Admitting: Obstetrics and Gynecology

## 2018-11-27 ENCOUNTER — Other Ambulatory Visit: Payer: Self-pay | Admitting: Obstetrics and Gynecology

## 2018-11-27 VITALS — BP 122/82 | HR 68 | Temp 97.0°F | Wt 252.0 lb

## 2018-11-27 DIAGNOSIS — Z6841 Body Mass Index (BMI) 40.0 and over, adult: Secondary | ICD-10-CM

## 2018-11-27 DIAGNOSIS — D259 Leiomyoma of uterus, unspecified: Secondary | ICD-10-CM

## 2018-11-27 DIAGNOSIS — F329 Major depressive disorder, single episode, unspecified: Secondary | ICD-10-CM

## 2018-11-27 DIAGNOSIS — F419 Anxiety disorder, unspecified: Secondary | ICD-10-CM

## 2018-11-27 DIAGNOSIS — R232 Flushing: Secondary | ICD-10-CM | POA: Diagnosis not present

## 2018-11-27 DIAGNOSIS — R4586 Emotional lability: Secondary | ICD-10-CM

## 2018-11-27 DIAGNOSIS — F32A Depression, unspecified: Secondary | ICD-10-CM

## 2018-11-27 DIAGNOSIS — N921 Excessive and frequent menstruation with irregular cycle: Secondary | ICD-10-CM

## 2018-11-27 DIAGNOSIS — N946 Dysmenorrhea, unspecified: Secondary | ICD-10-CM | POA: Diagnosis not present

## 2018-11-27 LAB — POCT URINE PREGNANCY: Preg Test, Ur: NEGATIVE

## 2018-11-27 MED ORDER — MEDROXYPROGESTERONE ACETATE 5 MG PO TABS: 5.0000 mg | ORAL_TABLET | Freq: Every day | ORAL | 0 refills | Status: DC

## 2018-11-27 NOTE — Progress Notes (Signed)
43 y.o. G0P0000 Married White or Caucasian Not Hispanic or Latino female here for a consultation from Dr Juleen China for irregular cycles, low iron and ferritin, and mood changes.   Lab work from 09/28/18 with Dr Juleen China: Normal TSH, normal CMP, normal lipid panel, normal HgbA1C, normal pap, negative hpv low iron and ferritin (7), normal hgb of 12.6  Her cycles were regular up until 2-3 years ago. She had a salpingectomy done for contraception about that time and was told she had fibroids.   Her cycles ranges from every 3-8 weeks x 2 days-6 weeks. She can saturate a pad in up to 3-4 hours at most. Severe cramps when she is bleeding heavily (varies). 800 mg of Motrin helps her cramps some. She spots prior to and after her cycle, she can bleed/spot daily for up to 6 weeks. Her weight has gone up and down her entire life, she is at her standard weight.   Sexually active, no dyspareunia.   She is struggling with her mood. She has had life long issues with depression and anxiety. Up until the last 1-2 months she had been stable on Vybrid for many years. She thinks her mood is affected by her cycle and is wondering if her recent worsening depression/anxiety are from hormonal changes.    No galactorrhea. She has had one hot flash (4 months ago). Occasional night sweats (long term). No vaginal dryness. She has some mild hair growth on her upper lip (normal, no change). No acne. She does have a h/o  thyroid nodules, just had normal TSH.    Period Pattern: (!) Irregular Menstrual Flow: Moderate, Light Menstrual Control: Thin pad Menstrual Control Change Freq (Hours): changes pad every 3-4 hours for 1 day, changes pad every 6-8 hours other days Dysmenorrhea: (!) Severe Dysmenorrhea Symptoms: Cramping  Patient's last menstrual period was 11/05/2018 (approximate).          Sexually active: Yes.    The current method of family planning is salpingectomy.    Exercising: No.  The patient does not participate  in regular exercise at present. Smoker:  no  Health Maintenance: Pap:  09/28/2018 WNL NEG HPV History of abnormal Pap:  no MMG:  09/20/2016 Birads 1 negative BMD:   2003- Depo inj, WNL Colonoscopy:  TDaP:  02/28/2010 Gardasil: Never   reports that she has never smoked. She has never used smokeless tobacco. She reports current alcohol use. She reports that she does not use drugs. 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 Work is very stressful right now.   Past Medical History:  Diagnosis Date  . Abnormal uterine bleeding   . Anemia   . Anxiety   . Arthritis   . Asthma   . Chicken pox   . Depression   . Dysmenorrhea   . Fibroid   . Hay fever   . Irritable bowel syndrome with both constipation and diarrhea 02/02/2014  . Obstructive sleep apnea syndrome 02/02/2014  On CPAP, sleeping okay.  She did have elevated BP on OCP's  Past Surgical History:  Procedure Laterality Date  . ABDOMINAL SURGERY    . ANTERIOR CRUCIATE LIGAMENT REPAIR    . CHOLECYSTECTOMY  2012  . EYE SURGERY Bilateral   . SALPINGECTOMY      Current Outpatient Medications  Medication Sig Dispense Refill  . clonazePAM (KLONOPIN) 0.5 MG tablet Take 1 tablet (0.5 mg total) by mouth 2 (two) times daily as needed for anxiety. 30 tablet 2  .  dicyclomine (BENTYL) 20 MG tablet Take 20 mg by mouth as needed.     Marland Kitchen ipratropium (ATROVENT) 0.06 % nasal spray Place 2 sprays into both nostrils 4 (four) times daily. 15 mL 0  . loratadine-pseudoephedrine (CLARITIN-D 12-HOUR) 5-120 MG tablet Take 1 tablet by mouth daily.    . Vilazodone HCl (VIIBRYD) 40 MG TABS Take 1 tablet (40 mg total) by mouth daily. 90 tablet 3   No current facility-administered medications for this visit.     Family History  Problem Relation Age of Onset  . Arthritis Mother   . Cancer Mother   . Breast cancer Mother   . Thyroid disease Mother   . Cancer Father   . Depression Father   . Diabetes Father   . Hearing loss  Father   . High Cholesterol Father   . Kidney disease Father   . Stroke Father   . Sleep apnea Father   . Hypertension Father   . Hyperlipidemia Father   . Arthritis Maternal Grandmother   . Cancer Maternal Grandmother   . Depression Maternal Grandmother   . Breast cancer Maternal Grandmother   . Birth defects Paternal Grandmother   . High Cholesterol Paternal Grandmother   . Heart attack Paternal Grandfather   . Heart disease Paternal Grandfather   . High Cholesterol Paternal Grandfather   . Hypertension Paternal Grandfather     Review of Systems  Constitutional: Negative.   HENT: Negative.   Eyes: Negative.   Respiratory: Negative.   Cardiovascular: Negative.   Gastrointestinal: Negative.   Endocrine: Negative.   Genitourinary: Positive for menstrual problem.  Musculoskeletal: Negative.   Skin: Negative.   Allergic/Immunologic: Negative.   Neurological: Negative.   Hematological: Negative.   Psychiatric/Behavioral:       Mood fluctuations   She is having some urinary urgency, drinks lots of coffee. No leakage.   Exam:   BP 122/82 (BP Location: Right Arm, Patient Position: Sitting, Cuff Size: Large)   Pulse 68   Temp (!) 97 F (36.1 C) (Skin)   Wt 252 lb (114.3 kg)   LMP 11/05/2018 (Approximate)   BMI 43.60 kg/m   Weight change: @WEIGHTCHANGE @ Height:      Ht Readings from Last 3 Encounters:  11/13/18 5' 3.75" (1.619 m)  09/28/18 5' 3.75" (1.619 m)  08/17/18 5' 3.75" (1.619 m)    General appearance: alert, cooperative and appears stated age Head: Normocephalic, without obvious abnormality, atraumatic Neck: no adenopathy, supple, symmetrical, trachea midline and thyroid right lobe>left lobe Lungs: clear to auscultation bilaterally Cardiovascular: regular rate and rhythm Abdomen: soft, non-tender; non distended,  no masses,  no organomegaly Extremities: extremities normal, atraumatic, no cyanosis or edema Skin: Skin color, texture, turgor normal. No rashes or  lesions Lymph nodes: Cervical, supraclavicular, and axillary nodes normal. No abnormal inguinal nodes palpated Neurologic: Grossly normal   Pelvic: External genitalia:  no lesions              Urethra:  normal appearing urethra with no masses, tenderness or lesions              Bartholins and Skenes: normal                 Vagina: normal appearing vagina with normal color and discharge, no lesions              Cervix: no cervical motion tenderness and no lesions               Bimanual Exam:  Uterus:  anteverted, mobile, slightly irregular, not appreciably enlarged.               Adnexa: no mass, fullness, tenderness                The risks of endometrial biopsy were reviewed and a consent was obtained.  A speculum was placed in the vagina and the cervix was cleansed with betadine. A tenaculum was placed on the cervix and the mini-pipelle was placed into the endometrial cavity. The uterus sounded to ~7 cm. The endometrial biopsy was performed, minimal tissue was obtained. The tenaculum and speculum were removed. There were no complications.    Chaperone was present for exam.  A:  Menometrorrhagia, normal TSH  Not anemic, but low iron stores  Severe dysmenorrhea  BMI 43  Depression and anxiety, severe mood changes in the last 6 weeks, she wonders if it is hormonal. She is also under a lot of stress.     P:   Prolactin, FSH, estradiol  UPT negative  Endometrial biopsy done  Provera 5 mg x 5 days now  Return for gyn ultrasound, possible sonohysterogram  Calendar cycles and mood  We discussed possible options for treatment, including trying Lo Loestrin (would have to make sure her BP didn't go up), cyclic provera and the mirena IUD  She is in counseling  I would recommend that she establish care with a Psychiatrist   CC: Dr Juleen China Note sent.

## 2018-11-27 NOTE — Patient Instructions (Signed)

## 2018-11-28 LAB — PROLACTIN: Prolactin: 8.2 ng/mL (ref 4.8–23.3)

## 2018-11-28 LAB — FOLLICLE STIMULATING HORMONE: FSH: 40.7 m[IU]/mL

## 2018-11-28 LAB — ESTRADIOL: Estradiol: 129 pg/mL

## 2018-11-30 ENCOUNTER — Other Ambulatory Visit: Payer: Self-pay

## 2018-11-30 DIAGNOSIS — F411 Generalized anxiety disorder: Secondary | ICD-10-CM | POA: Diagnosis not present

## 2018-11-30 DIAGNOSIS — F419 Anxiety disorder, unspecified: Secondary | ICD-10-CM

## 2018-11-30 DIAGNOSIS — Z79899 Other long term (current) drug therapy: Secondary | ICD-10-CM

## 2018-11-30 DIAGNOSIS — D259 Leiomyoma of uterus, unspecified: Secondary | ICD-10-CM

## 2018-11-30 DIAGNOSIS — N921 Excessive and frequent menstruation with irregular cycle: Secondary | ICD-10-CM

## 2018-12-03 ENCOUNTER — Telehealth: Payer: Self-pay | Admitting: Obstetrics and Gynecology

## 2018-12-03 NOTE — Telephone Encounter (Signed)
Call placed to patient to review benefit and schedule an ultrasound.  Left voicemail message requesting a return call

## 2018-12-05 ENCOUNTER — Ambulatory Visit: Payer: 59

## 2018-12-07 DIAGNOSIS — F411 Generalized anxiety disorder: Secondary | ICD-10-CM | POA: Diagnosis not present

## 2018-12-09 NOTE — Progress Notes (Signed)
Virtual Visit via Video   Due to the COVID-19 pandemic, this visit was completed with telemedicine (audio/video) technology to reduce patient and provider exposure as well as to preserve personal protective equipment.   I connected with Tori Milks by a video enabled telemedicine application and verified that I am speaking with the correct person using two identifiers. Location patient: Home Location provider: [facility], Office Persons participating in the virtual visit: Demetra, Armendariz, DO   I discussed the limitations of evaluation and management by telemedicine and the availability of in person appointments. The patient expressed understanding and agreed to proceed.  Care Team   Patient Care Team: Briscoe Deutscher, DO as PCP - General (Family Medicine) Nicholes Stairs, MD as Consulting Physician (Orthopedic Surgery)  Subjective:   HPI: Patient has had increased depression. She wanted to follow up with referral and getting in with psychiatry.   Known history of depression and anxiety.  Previously diagnosed with bipolar disorder as well.  She says that this was subsequently taken off of her medical history.  She has been trialed on multiple medications and has done fairly well on her current regimen until COVID precautions.  She is working from home and struggling with this.  She has talked to her supervisor at work and is hoping to hear soon whether or not she can go back into a physical building for work.  Already seeing a therapist.  No SI.  Review of Systems  Constitutional: Negative.   HENT: Negative.   Eyes: Negative.   Respiratory: Negative.   Cardiovascular: Negative.   Gastrointestinal: Negative.   Genitourinary: Negative.   Musculoskeletal: Negative.   Skin: Negative.   Neurological: Negative.   Endo/Heme/Allergies: Negative.   Psychiatric/Behavioral: Positive for depression.     Patient Active Problem List   Diagnosis Date Noted  . Low  ferritin 11/13/2018  . History of uterine fibroid 11/13/2018  . Labile mood 11/13/2018  . Chronic pain of right ankle 11/11/2018  . Unstable right ankle 11/11/2018  . Psychophysiological insomnia 08/17/2018  . Excessive daytime sleepiness 06/28/2017  . Hypersomnia with sleep apnea 06/28/2017  . 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 Other parasomnia 06/28/2017  . Morbid obesity with body mass index (BMI) of 40.0 to 49.9 ([place]) 06/28/2017  . Skin lesion of face 05/01/2017  . Status post bilateral salpingectomy 08/17/2015  . Asthma 07/31/2015  . Uterine leiomyoma 07/15/2015  . Anxiety 05/08/2015  . IDA (iron deficiency anemia) 05/08/2015  . Cervical disc disease 02/13/2014  . Thyroid nodule 02/13/2014  . Obstructive sleep apnea syndrome 02/02/2014  . GERD (gastroesophageal reflux disease) 02/02/2014  . Irritable bowel syndrome with both constipation and diarrhea 02/02/2014  . MDD (recurrent major depressive disorder) in remission ([place]) 02/02/2014  . Morbid obesity ([place]) 02/02/2014  . Panic disorder 02/02/2014    Social History   Tobacco Use  . Smoking status: Never Smoker  . Smokeless tobacco: Never Used  Substance Use Topics  . Alcohol use: Yes    Comment: Occasionally    Current Outpatient Medications:  .  clonazePAM (KLONOPIN) 0.5 MG tablet, Take 1 tablet (0.5 mg total) by mouth 2 (two) times daily as needed for anxiety., Disp: 30 tablet, Rfl: 2 .  dicyclomine (BENTYL) 20 MG tablet, Take 20 mg by mouth as needed. , Disp: , Rfl:  .  ipratropium (ATROVENT) 0.06 % nasal spray, Place 2 sprays into both nostrils 4 (four) times daily., Disp: 15 mL, Rfl: 0 .  loratadine-pseudoephedrine (CLARITIN-D  12-HOUR) 5-120 MG tablet, Take 1 tablet by mouth daily., Disp: , Rfl:  .  Vilazodone HCl (VIIBRYD) 40 MG TABS, Take 1 tablet (40 mg total) by mouth daily., Disp: 90 tablet, Rfl: 3 .  lamoTRIgine (LAMICTAL) 25 MG tablet, Take 1 tablet (25 mg total) by mouth daily., Disp: 30  tablet, Rfl: 2 .  medroxyPROGESTERone (PROVERA) 5 MG tablet, Take 1 tablet (5 mg total) by mouth daily. (Patient not taking: Reported on 12/10/2018), Disp: 5 tablet, Rfl: 0  Allergies  Allergen Reactions  . Bacitracin Rash  . Other     Dermabond  . Neomycin Rash    Objective:   VITALS: Per patient if applicable, see vitals. GENERAL: Alert, appears well and in no acute distress. HEENT: Atraumatic, conjunctiva clear, no obvious abnormalities on inspection of external nose and ears. NECK: Normal movements of the head and neck. CARDIOPULMONARY: No increased WOB. Speaking in clear sentences. I:E ratio WNL.  MS: Moves all visible extremities without noticeable abnormality. PSYCH: Pleasant and cooperative, well-groomed. Speech normal rate and rhythm. Affect is appropriate. Insight and judgement are appropriate. Attention is focused, linear, and appropriate.  NEURO: CN grossly intact. Oriented as arrived to appointment on time with no prompting. Moves both UE equally.  SKIN: No obvious lesions, wounds, erythema, or cyanosis noted on face or hands.  Depression screen [facility] 2/9 11/13/2018 09/28/2018 08/17/2018  Decreased Interest 1 0 1  Down, Depressed, Hopeless 1 1 1   PHQ - 2 Score 2 1 2   Altered sleeping 0 1 2  Tired, decreased energy 1 1 1   Change in appetite 3 2 3   Feeling bad or failure about yourself  1 0 0  Trouble concentrating 1 0 1  Moving slowly or fidgety/restless 0 0 0  Suicidal thoughts 0 0 0  PHQ-9 Score 8 5 9   Difficult doing work/chores Somewhat difficult Somewhat difficult Somewhat difficult    Assessment and Plan:   Kamira was seen today for follow-up.  Diagnoses and all orders for this visit:  MDD (recurrent major depressive disorder) in remission [facility]) Comments: Referral placed on 11/30/18. Will see why there has been a delay in process. Will trial low dose Lamictal to see if helpful as mood stabilizer.  Orders: -     Vilazodone HCl (VIIBRYD) 40 MG TABS; Take 1  tablet (40 mg total) by mouth daily. -     lamoTRIgine (LAMICTAL) 25 MG tablet; Take 1 tablet (25 mg total) by mouth daily. -     Ambulatory referral to Psychiatry  Anxiety -     clonazePAM (KLONOPIN) 0.5 MG tablet; Take 1 tablet (0.5 mg total) by mouth 2 (two) times daily as needed for anxiety. -     Ambulatory referral to Psychiatry  Vasomotor rhinitis -     ipratropium (ATROVENT) 0.06 % nasal spray; Place 2 sprays into both nostrils 4 (four) times daily.    Marland Kitchen COVID-19 Education: The signs and symptoms of COVID-19 were discussed with the patient and how to seek care for testing if needed. The importance of social distancing was discussed today. . Reviewed expectations re: course of current medical issues. . Discussed self-management of symptoms. . Outlined signs and symptoms indicating need for more acute intervention. . Patient verbalized understanding and all questions were answered. Marland Kitchen Health Maintenance issues including appropriate healthy diet, exercise, and smoking avoidance were discussed with patient. . See orders for this visit as documented in the electronic medical record.  Briscoe Deutscher, DO  Records requested if needed. Time  spent: 25 minutes, of which >50% was spent in obtaining information about her symptoms, reviewing her previous labs, evaluations, and treatments, counseling her about her condition (please see the discussed topics above), and developing a plan to further investigate it; she had a number of questions which I addressed.

## 2018-12-10 ENCOUNTER — Encounter: Payer: Self-pay | Admitting: Family Medicine

## 2018-12-10 ENCOUNTER — Ambulatory Visit (INDEPENDENT_AMBULATORY_CARE_PROVIDER_SITE_OTHER): Payer: 59 | Admitting: Family Medicine

## 2018-12-10 VITALS — Wt 252.0 lb

## 2018-12-10 DIAGNOSIS — F3340 Major depressive disorder, recurrent, in remission, unspecified: Secondary | ICD-10-CM

## 2018-12-10 DIAGNOSIS — F419 Anxiety disorder, unspecified: Secondary | ICD-10-CM

## 2018-12-10 DIAGNOSIS — J300 Vasomotor rhinitis: Secondary | ICD-10-CM | POA: Diagnosis not present

## 2018-12-10 MED ORDER — CLONAZEPAM 0.5 MG PO TABS: 0.5000 mg | ORAL_TABLET | Freq: Two times a day (BID) | ORAL | 2 refills | Status: AC | PRN

## 2018-12-10 MED ORDER — IPRATROPIUM BROMIDE 0.06 % NA SOLN: 2.0000 | Freq: Four times a day (QID) | NASAL | 0 refills | Status: AC

## 2018-12-10 MED ORDER — LAMOTRIGINE 25 MG PO TABS: 25.0000 mg | ORAL_TABLET | Freq: Every day | ORAL | 2 refills | Status: DC

## 2018-12-10 MED ORDER — VIIBRYD 40 MG PO TABS: 40.0000 mg | ORAL_TABLET | Freq: Every day | ORAL | 3 refills | Status: AC

## 2018-12-10 MED FILL — lamoTRIgine 25 MG TABS: 25 | 30 days supply | Qty: 30 | Fill #0

## 2018-12-10 NOTE — Telephone Encounter (Signed)
Call placed to patient to review benefit for an ultrasound with possible sonohysterogram. Patient acknowledges understanding of information presented. Patient is scheduled 12/25/2018 with Dr Talbert Nan. Patient is aware of the appointment date, arrival time and cancellation policy.  Patient adds, she has not had the prescription for "progesterone" filled yet, but will fill this week. Patient wants to know if this will conflict with appointment scheduling?  Routing to Dr Talbert Nan  cc: Reesa Chew, RN

## 2018-12-10 NOTE — Telephone Encounter (Signed)
She should take the provera now.

## 2018-12-10 NOTE — Telephone Encounter (Signed)
Spoke with patient. Patient verbalizes understanding. Patient will start Provera and document when she takes the medication and if she does or does not have any bleeding following. Patient needs to move [facility] due to conflict with work. [facility] moved to 01/01/2019 at 1:30 pm. Patient is agreeable to date and time.  Routing to provider and will close encounter.

## 2018-12-12 ENCOUNTER — Ambulatory Visit: Payer: 59 | Admitting: Obstetrics and Gynecology

## 2018-12-14 DIAGNOSIS — F411 Generalized anxiety disorder: Secondary | ICD-10-CM | POA: Diagnosis not present

## 2018-12-16 ENCOUNTER — Encounter: Payer: Self-pay | Admitting: Family Medicine

## 2018-12-19 ENCOUNTER — Encounter: Payer: Self-pay | Admitting: Family Medicine

## 2018-12-25 ENCOUNTER — Other Ambulatory Visit: Payer: 59

## 2018-12-25 ENCOUNTER — Other Ambulatory Visit: Payer: 59 | Admitting: Obstetrics and Gynecology

## 2018-12-28 DIAGNOSIS — M25569 Pain in unspecified knee: Secondary | ICD-10-CM | POA: Insufficient documentation

## 2018-12-28 DIAGNOSIS — F411 Generalized anxiety disorder: Secondary | ICD-10-CM | POA: Diagnosis not present

## 2018-12-29 ENCOUNTER — Emergency Department
Admission: EM | Admit: 2018-12-29 | Discharge: 2018-12-29 | Disposition: A | Discharge status: Home / Self Care | Payer: 59 | Source: Home / Self Care

## 2018-12-29 ENCOUNTER — Encounter: Payer: Self-pay | Admitting: Emergency Medicine

## 2018-12-29 ENCOUNTER — Emergency Department (INDEPENDENT_AMBULATORY_CARE_PROVIDER_SITE_OTHER): Payer: 59

## 2018-12-29 ENCOUNTER — Other Ambulatory Visit: Payer: Self-pay

## 2018-12-29 DIAGNOSIS — M25562 Pain in left knee: Secondary | ICD-10-CM

## 2018-12-29 DIAGNOSIS — M25462 Effusion, left knee: Secondary | ICD-10-CM

## 2018-12-29 DIAGNOSIS — M1712 Unilateral primary osteoarthritis, left knee: Secondary | ICD-10-CM | POA: Diagnosis not present

## 2018-12-29 DIAGNOSIS — M2570 Osteophyte, unspecified joint: Secondary | ICD-10-CM

## 2018-12-29 MED ORDER — TRAMADOL HCL 50 MG PO TABS: 50.0000 mg | ORAL_TABLET | Freq: Four times a day (QID) | ORAL | 0 refills | Status: DC | PRN

## 2018-12-29 NOTE — ED Provider Notes (Signed)
[facility]    CSN: [id number] Arrival date & time: 12/29/18  1105      History   Chief Complaint Chief Complaint  Patient presents with  . Knee Pain    HPI Cindy Andrade is a 43 y.o. female.   HPI  Cindy Andrade is a 43 y.o. female presenting to UC with c/o Left knee pain that started yesterday after "being more active than usual."  She does not recall a specific injury but has increased pain and swelling of her Left knee, and a sensation her knee will "give out" similar to when she tore her ACL about 13 years ago. She had her ACL surgically repaired at that time but she is concerned she is going to need another surgery based on how her knee feels today.  Pain is 4/10 at this time in the medial knee joint.  She has been using a knee sleeve with significant relief of pain and weakness sensation.  No medication taken PTA. She would like a referral to a [facility] to try to stay in-network.  She is also requesting crutches today.    Past Medical History:  Diagnosis Date  . Abnormal uterine bleeding   . Anemia   . Anxiety   . Arthritis   . Asthma   . Chicken pox   . Depression   . Dysmenorrhea   . Fibroid   . Hay fever   . Irritable bowel syndrome with both constipation and diarrhea 02/02/2014  . Obstructive sleep apnea syndrome 02/02/2014    Patient Active Problem List   Diagnosis Date Noted  . Low ferritin 11/13/2018  . History of uterine fibroid 11/13/2018  . Labile mood 11/13/2018  . Chronic pain of right ankle 11/11/2018  . Unstable right ankle 11/11/2018  . Psychophysiological insomnia 08/17/2018  . Excessive daytime sleepiness 06/28/2017  . Hypersomnia with sleep apnea 06/28/2017  . 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 Other parasomnia 06/28/2017  . Morbid obesity with body mass index (BMI) of 40.0 to 49.9 ([place]) 06/28/2017  . Skin lesion of face 05/01/2017  . Status post bilateral salpingectomy 08/17/2015  . Asthma 07/31/2015   . Uterine leiomyoma 07/15/2015  . Anxiety 05/08/2015  . IDA (iron deficiency anemia) 05/08/2015  . Cervical disc disease 02/13/2014  . Thyroid nodule 02/13/2014  . Obstructive sleep apnea syndrome 02/02/2014  . GERD (gastroesophageal reflux disease) 02/02/2014  . Irritable bowel syndrome with both constipation and diarrhea 02/02/2014  . MDD (recurrent major depressive disorder) in remission ([place]) 02/02/2014  . Morbid obesity ([place]) 02/02/2014  . Panic disorder 02/02/2014    Past Surgical History:  Procedure Laterality Date  . ABDOMINAL SURGERY    . ANTERIOR CRUCIATE LIGAMENT REPAIR    . CHOLECYSTECTOMY  2012  . EYE SURGERY Bilateral   . SALPINGECTOMY      OB History    Gravida  0   Para  0   Term  0   Preterm  0   AB  0   Living  0     SAB  0   TAB  0   Ectopic  0   Multiple  0   Live Births  0            Home Medications    Prior to Admission medications   Medication Sig Start Date End Date Taking? Authorizing Provider  clonazePAM (KLONOPIN) 0.5 MG tablet Take 1 tablet (0.5 mg total) by mouth 2 (two) times daily as needed for  anxiety. 12/10/18   Briscoe Deutscher, DO  dicyclomine (BENTYL) 20 MG tablet Take 20 mg by mouth as needed.     [provider]  ipratropium (ATROVENT) 0.06 % nasal spray Place 2 sprays into both nostrils 4 (four) times daily. 12/10/18   Briscoe Deutscher, DO  lamoTRIgine (LAMICTAL) 25 MG tablet Take 1 tablet (25 mg total) by mouth daily. 12/10/18   Briscoe Deutscher, DO  loratadine-pseudoephedrine (CLARITIN-D 12-HOUR) 5-120 MG tablet Take 1 tablet by mouth daily.    [provider]  medroxyPROGESTERone (PROVERA) 5 MG tablet Take 1 tablet (5 mg total) by mouth daily. Patient not taking: Reported on 12/10/2018 11/27/18   Salvadore Dom, MD  traMADol (ULTRAM) 50 MG tablet Take 1 tablet (50 mg total) by mouth every 6 (six) hours as needed. 12/29/18   Noe Gens, PA-C  Vilazodone HCl (VIIBRYD) 40 MG TABS Take 1  tablet (40 mg total) by mouth daily. 12/10/18   Briscoe Deutscher, DO    Family History Family History  Problem Relation Age of Onset  . Arthritis Mother   . Cancer Mother   . Breast cancer Mother   . Thyroid disease Mother   . Cancer Father   . Depression Father   . Diabetes Father   . Hearing loss Father   . High Cholesterol Father   . Kidney disease Father   . Stroke Father   . Sleep apnea Father   . Hypertension Father   . Hyperlipidemia Father   . Arthritis Maternal Grandmother   . Cancer Maternal Grandmother   . Depression Maternal Grandmother   . Breast cancer Maternal Grandmother   . Birth defects Paternal Grandmother   . High Cholesterol Paternal Grandmother   . Heart attack Paternal Grandfather   . Heart disease Paternal Grandfather   . High Cholesterol Paternal Grandfather   . Hypertension Paternal Grandfather     Social History Social History   Tobacco Use  . Smoking status: Never Smoker  . Smokeless tobacco: Never Used  Substance Use Topics  . Alcohol use: Yes    Comment: Occasionally  . Drug use: Never     Allergies   Bacitracin, Other, and Neomycin   Review of Systems Review of Systems  Musculoskeletal: Positive for arthralgias, gait problem and joint swelling.  Skin: Negative for color change and wound.     Physical Exam Triage Vital Signs ED Triage Vitals  Enc Vitals Group     BP 12/29/18 1113 121/82     Pulse Rate 12/29/18 1113 82     Resp --      Temp 12/29/18 1113 97.8 F (36.6 C)     Temp Source 12/29/18 1113 Oral     SpO2 12/29/18 1113 97 %     Weight 12/29/18 1111 250 lb (113.4 kg)     Height --      Head Circumference --      Peak Flow --      Pain Score 12/29/18 1111 4     Pain Loc --      Pain Edu? --      Excl. in [place]? --    No data found.  Updated Vital Signs BP 121/82 (BP Location: Right Arm)   Pulse 82   Temp 97.8 F (36.6 C) (Oral)   Wt 250 lb (113.4 kg)   LMP 12/15/2018 (Approximate)   SpO2 97%   BMI  43.25 kg/m   Visual Acuity Right Eye Distance:   Left Eye Distance:  Bilateral Distance:    Right Eye Near:   Left Eye Near:    Bilateral Near:     Physical Exam Vitals signs and nursing note reviewed.  Constitutional:      Appearance: Normal appearance. She is well-developed.  HENT:     Head: Normocephalic and atraumatic.  Neck:     Musculoskeletal: Normal range of motion.  Cardiovascular:     Rate and Rhythm: Normal rate.  Pulmonary:     Effort: Pulmonary effort is normal.  Musculoskeletal: Normal range of motion.        General: Swelling and tenderness present.     Comments: Left knee: mild edema compared to Right, tenderness to medial joint space. Full flexion and extension w/o crepitus. Calf is soft, non-tender.   Skin:    General: Skin is warm and dry.     Findings: No bruising or erythema.  Neurological:     Mental Status: She is alert and oriented to person, place, and time.  Psychiatric:        Behavior: Behavior normal.      UC Treatments / Results  Labs (all labs ordered are listed, but only abnormal results are displayed) Labs Reviewed - No data to display  EKG   Radiology Dg Knee Complete 4 Views Left  Result Date: 12/29/2018 CLINICAL DATA:  Unstable knee, remote history of ACL repair, no recent injury EXAM: LEFT KNEE - COMPLETE 4+ VIEW COMPARISON:  None. FINDINGS: No fracture or dislocation of the left knee. There is mild medial compartment joint space narrowing and osteophytosis. The remaining compartments are intact. There is evidence of a prior tibial tunnel ACL graft reconstruction. There is a moderate, nonspecific knee joint effusion. Soft tissues are unremarkable. IMPRESSION: 1. No fracture or dislocation of the left knee. 2. There is mild medial compartment joint space narrowing and osteophytosis. The remaining compartments are intact. 3. There is evidence of a prior tibial tunnel ACL graft reconstruction, which may be further assessed for  integrity by MRI. 4.  There is a moderate, nonspecific knee joint effusion. Electronically Signed   By: Eddie Candle M.D.   On: 12/29/2018 12:24    Procedures Procedures (including critical care time)  Medications Ordered in UC Medications - No data to display  Initial Impression / Assessment and Plan / UC Course  I have reviewed the triage vital signs and the nursing notes.  Pertinent labs & imaging results that were available during my care of the patient were reviewed by me and considered in my medical decision making (see chart for details).     Discussed imaging with pt No [facility] specific orthopedist in the system that I am aware of, however, encouraged to f/u with [facility] for further evaluation of knee to see if surgery is even indicated at this time. Crutches provided today along with small amount of tramadol. Considered prescribiing meloxiam, however, pt is on an SSRI, will try to avoid NSAIDs due to increased risk of GI bleed. Pt understanding and agreeable with tx plan.  Final Clinical Impressions(s) / UC Diagnoses   Final diagnoses:  Pain and swelling of left knee  Osteophytosis  Effusion of left knee     Discharge Instructions      Tramadol is strong pain medication. While taking, do not drink alcohol, drive, or perform any other activities that requires focus while taking these medications.   You may also take acetaminophen 500mg  every 4-6 hours as needed for pain.  Try to keep  your leg elevated and apply a cool compress for 15-20 minutes at a time this weekend.  Please call Dr. Landry Corporal, Sports Medicine, office to schedule a follow up appointment.  Per today's x-ray report, an MRI may be needed to further assess the integrity of your prior ACL graft.        ED Prescriptions    Medication Sig Dispense Auth. Provider   traMADol (ULTRAM) 50 MG tablet Take 1 tablet (50 mg total) by mouth every 6 (six) hours as needed. 10 tablet Noe Gens, PA-C     I have reviewed the PDMP during this encounter.   Noe Gens, [place] 12/30/18 [phone]

## 2018-12-29 NOTE — Discharge Instructions (Signed)
°  Tramadol is strong pain medication. While taking, do not drink alcohol, drive, or perform any other activities that requires focus while taking these medications.   You may also take acetaminophen 500mg  every 4-6 hours as needed for pain.  Try to keep your leg elevated and apply a cool compress for 15-20 minutes at a time this weekend.  Please call Dr. Landry Corporal, Sports Medicine, office to schedule a follow up appointment.  Per today's x-ray report, an MRI may be needed to further assess the integrity of your prior ACL graft.

## 2018-12-29 NOTE — ED Triage Notes (Signed)
Pt states yesterday her left knee started hurting terribly. States she tore her ACL about 13 years ago and this feels the same way.

## 2018-12-31 ENCOUNTER — Other Ambulatory Visit: Payer: Self-pay

## 2019-01-01 ENCOUNTER — Other Ambulatory Visit: Payer: 59

## 2019-01-01 ENCOUNTER — Encounter: Payer: Self-pay | Admitting: Obstetrics and Gynecology

## 2019-01-01 ENCOUNTER — Ambulatory Visit (INDEPENDENT_AMBULATORY_CARE_PROVIDER_SITE_OTHER): Payer: 59

## 2019-01-01 ENCOUNTER — Ambulatory Visit (INDEPENDENT_AMBULATORY_CARE_PROVIDER_SITE_OTHER): Payer: 59 | Admitting: Obstetrics and Gynecology

## 2019-01-01 ENCOUNTER — Other Ambulatory Visit: Payer: Self-pay | Admitting: Obstetrics and Gynecology

## 2019-01-01 VITALS — BP 140/62 | HR 80 | Temp 97.3°F | Ht 63.75 in | Wt 253.0 lb

## 2019-01-01 DIAGNOSIS — N921 Excessive and frequent menstruation with irregular cycle: Secondary | ICD-10-CM | POA: Diagnosis not present

## 2019-01-01 DIAGNOSIS — N841 Polyp of cervix uteri: Secondary | ICD-10-CM | POA: Diagnosis not present

## 2019-01-01 DIAGNOSIS — N946 Dysmenorrhea, unspecified: Secondary | ICD-10-CM

## 2019-01-01 DIAGNOSIS — D259 Leiomyoma of uterus, unspecified: Secondary | ICD-10-CM

## 2019-01-01 DIAGNOSIS — M25562 Pain in left knee: Secondary | ICD-10-CM | POA: Diagnosis not present

## 2019-01-01 NOTE — Patient Instructions (Signed)
Hysteroscopy Hysteroscopy is a procedure that is used to examine the inside of a woman's womb (uterus). This may be done for various reasons, including:  To look for lumps (tumors) and other growths in the uterus.  To evaluate abnormal bleeding, fibroid tumors, polyps, scar tissue (adhesions), or cancer of the uterus.  To determine the cause of an inability to get pregnant (infertility) or repeated losses of pregnancies (miscarriages).  To find a lost IUD (intrauterine device).  To perform a procedure that permanently prevents pregnancy (sterilization). During this procedure, a thin, flexible tube with a small light and camera (hysteroscope) is used to examine the uterus. The camera sends images to a monitor in the room so that your health care provider can view the inside of your uterus. A hysteroscopy should be done right after a menstrual period to make sure that you are not pregnant. Tell a health care provider about:  Any allergies you have.  All medicines you are taking, including vitamins, herbs, eye drops, creams, and over-the-counter medicines.  Any problems you or family members have had with the use of anesthetic medicines.  Any blood disorders you have.  Any surgeries you have had.  Any medical conditions you have.  Whether you are pregnant or may be pregnant. What are the risks? Generally, this is a safe procedure. However, problems may occur, including:  Excessive bleeding.  Infection.  Damage to the uterus or other structures or organs.  Allergic reaction to medicines or fluids that are used in the procedure. What happens before the procedure? Staying hydrated Follow instructions from your health care provider about hydration, which may include:  Up to 2 hours before the procedure - you may continue to drink clear liquids, such as water, clear fruit juice, black coffee, and plain tea. Eating and drinking restrictions Follow instructions from your health care  provider about eating and drinking, which may include:  8 hours before the procedure - stop eating solid foods and drink clear liquids only  2 hours before the procedure - stop drinking clear liquids. General instructions  Ask your health care provider about: ? Changing or stopping your normal medicines. This is important if you take diabetes medicines or blood thinners. ? Taking medicines such as aspirin and ibuprofen. These medicines can thin your blood and cause bleeding. Do not take these medicines for 1 week before your procedure, or as told by your health care provider.  Do not use any products that contain nicotine or tobacco for 2 weeks before the procedure. This includes cigarettes and e-cigarettes. If you need help quitting, ask your health care provider.  Medicine may be placed in your cervix the day before the procedure. This medicine causes the cervix to have a larger opening (dilate). The larger opening makes it easier for the hysteroscope to be inserted into the uterus during the procedure.  Plan to have someone with you for the first 24-48 hours after the procedure, especially if you are given a medicine to make you fall asleep (general anesthetic).  Plan to have someone take you home from the hospital or clinic. What happens during the procedure?  To lower your risk of infection: ? Your health care team will wash or sanitize their hands. ? Your skin will be washed with soap. ? Hair may be removed from the surgical area.  An IV tube will be inserted into one of your veins.  You may be given one or more of the following: ? A medicine to help  you relax (sedative). ? A medicine that numbs the area around the cervix (local anesthetic). ? A medicine to make you fall asleep (general anesthetic).  A hysteroscope will be inserted through your vagina and into your uterus.  Air or fluid will be used to enlarge your uterus, enabling your health care provider to see your uterus  better. The amount of fluid used will be carefully checked throughout the procedure.  In some cases, tissue may be gently scraped from inside the uterus and sent to a lab for testing (biopsy). The procedure may vary among health care providers and hospitals. What happens after the procedure?  Your blood pressure, heart rate, breathing rate, and blood oxygen level will be monitored until the medicines you were given have worn off.  You may have some cramping. You may be given medicines for this.  You may have bleeding, which varies from light spotting to menstrual-like bleeding. This is normal.  If you had a biopsy done, it is your responsibility to get the results of your procedure. Ask your health care provider, or the department performing the procedure, when your results will be ready. Summary  Hysteroscopy is a procedure that is used to examine the inside of a woman's womb (uterus).  After the procedure, you may have bleeding, which varies from light spotting to menstrual-like bleeding. This is normal. You may also have cramping.  Plan to have someone take you home from the hospital or clinic. This information is not intended to replace advice given to you by your health care provider. Make sure you discuss any questions you have with your health care provider. Document Released: 05/23/2000 Document Revised: 01/27/2017 Document Reviewed: 03/15/2016 Elsevier Patient Education  2020 Elsevier Inc.  

## 2019-01-01 NOTE — Progress Notes (Signed)
GYNECOLOGY  VISIT   HPI: 43 y.o.   Married White or Caucasian Not Hispanic or Latino  female   [id number] with Patient's last menstrual period was 12/15/2018 (approximate).   here for further evaluation of menometrorrhagia and severe dysmenorrhea. She has had a normal TSH, normal prolactin, slightly elevated FSH of 40.7, normal HGB, but low iron stores. Endometrial biopsy with proliferative endometrium.  She cycles every 3-8 weeks x 2 days to 6 weeks.  GYNECOLOGIC HISTORY: Patient's last menstrual period was 12/15/2018 (approximate). Contraception: salpingectomy Menopausal hormone therapy: no        OB History    Gravida  0   Para  0   Term  0   Preterm  0   AB  0   Living  0     SAB  0   TAB  0   Ectopic  0   Multiple  0   Live Births  0              Patient Active Problem List   Diagnosis Date Noted  . Low ferritin 11/13/2018  . History of uterine fibroid 11/13/2018  . Labile mood 11/13/2018  . Chronic pain of right ankle 11/11/2018  . Unstable right ankle 11/11/2018  . Psychophysiological insomnia 08/17/2018  . Excessive daytime sleepiness 06/28/2017  . Hypersomnia with sleep apnea 06/28/2017  . 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 Other parasomnia 06/28/2017  . Morbid obesity with body mass index (BMI) of 40.0 to 49.9 ([place]) 06/28/2017  . Skin lesion of face 05/01/2017  . Status post bilateral salpingectomy 08/17/2015  . Asthma 07/31/2015  . Uterine leiomyoma 07/15/2015  . Anxiety 05/08/2015  . IDA (iron deficiency anemia) 05/08/2015  . Cervical disc disease 02/13/2014  . Thyroid nodule 02/13/2014  . Obstructive sleep apnea syndrome 02/02/2014  . GERD (gastroesophageal reflux disease) 02/02/2014  . Irritable bowel syndrome with both constipation and diarrhea 02/02/2014  . MDD (recurrent major depressive disorder) in remission ([place]) 02/02/2014  . Morbid obesity ([place]) 02/02/2014  . Panic disorder 02/02/2014    Past Medical History:   Diagnosis Date  . Abnormal uterine bleeding   . Anemia   . Anxiety   . Arthritis   . Asthma   . Chicken pox   . Depression   . Dysmenorrhea   . Fibroid   . Hay fever   . Irritable bowel syndrome with both constipation and diarrhea 02/02/2014  . Obstructive sleep apnea syndrome 02/02/2014    Past Surgical History:  Procedure Laterality Date  . ANTERIOR CRUCIATE LIGAMENT REPAIR    . EYE SURGERY Bilateral   . LAPAROSCOPIC BILATERAL SALPINGECTOMY    . LAPAROSCOPIC CHOLECYSTECTOMY  2013    Current Outpatient Medications  Medication Sig Dispense Refill  . clonazePAM (KLONOPIN) 0.5 MG tablet Take 1 tablet (0.5 mg total) by mouth 2 (two) times daily as needed for anxiety. 30 tablet 2  . dicyclomine (BENTYL) 20 MG tablet Take 20 mg by mouth as needed.     Marland Kitchen ipratropium (ATROVENT) 0.06 % nasal spray Place 2 sprays into both nostrils 4 (four) times daily. 15 mL 0  . lamoTRIgine (LAMICTAL) 25 MG tablet Take 1 tablet (25 mg total) by mouth daily. 30 tablet 2  . loratadine-pseudoephedrine (CLARITIN-D 12-HOUR) 5-120 MG tablet Take 1 tablet by mouth daily.    . Vilazodone HCl (VIIBRYD) 40 MG TABS Take 1 tablet (40 mg total) by mouth daily. 90 tablet 3   No current facility-administered medications for this visit.  ALLERGIES: Bacitracin, Other, and Neomycin  Family History  Problem Relation Age of Onset  . Arthritis Mother   . Cancer Mother   . Breast cancer Mother   . Thyroid disease Mother   . Cancer Father   . Depression Father   . Diabetes Father   . Hearing loss Father   . High Cholesterol Father   . Kidney disease Father   . Stroke Father   . Sleep apnea Father   . Hypertension Father   . Hyperlipidemia Father   . Arthritis Maternal Grandmother   . Cancer Maternal Grandmother   . Depression Maternal Grandmother   . Breast cancer Maternal Grandmother   . Birth defects Paternal Grandmother   . High Cholesterol Paternal Grandmother   . Heart attack Paternal  Grandfather   . Heart disease Paternal Grandfather   . High Cholesterol Paternal Grandfather   . Hypertension Paternal Grandfather     Social History   Socioeconomic History  . Marital status: Married    Spouse name: Not on file  . Number of children: Not on file  . Years of education: Not on file  . Highest education level: Not on file  Occupational History    Employer: [employer]  . Financial resource strain: Not on file  . Food insecurity    Worry: Not on file    Inability: Not on file  . Transportation needs    Medical: Not on file    Non-medical: Not on file  Tobacco Use  . Smoking status: Never Smoker  . Smokeless tobacco: Never Used  Substance and Sexual Activity  . Alcohol use: Yes    Comment: Occasionally  . Drug use: Never  . Sexual activity: Yes    Birth control/protection: Surgical    Comment: salpingectomy  Lifestyle  . Physical activity    Days per week: Not on file    Minutes per session: Not on file  . Stress: Not on file  Relationships  . Social Herbalist on phone: Not on file    Gets together: Not on file    Attends religious service: Not on file    Active member of club or organization: Not on file    Attends meetings of clubs or organizations: Not on file    Relationship status: Not on file  . Intimate partner violence    Fear of current or ex partner: Not on file    Emotionally abused: Not on file    Physically abused: Not on file    Forced sexual activity: Not on file  Other Topics Concern  . Not on file  Social History Narrative  . Not on file    Review of Systems  Gastrointestinal:       IBS worse with stress.   Musculoskeletal: Positive for joint pain.       Left knee pain  All other systems reviewed and are negative.   PHYSICAL EXAMINATION:    BP 140/62   Pulse 80   Temp (!) 97.3 F (36.3 C)   Ht 5' 3.75" (1.619 m)   Wt 253 lb (114.8 kg)   LMP 12/15/2018 (Approximate)   SpO2 97%   BMI 43.77  kg/m     General appearance: alert, cooperative and appears stated age  Pelvic: External genitalia:  no lesions              Urethra:  normal appearing urethra with no masses, tenderness or lesions  Bartholins and Skenes: normal                 Vagina: normal appearing vagina with normal color and discharge, no lesions              Cervix: no lesions  Sonohysterogram The procedure and risks of the procedure were reviewed with the patient, consent form was signed. A speculum was placed in the vagina and the cervix was cleansed with betadine. The sonohysterogram catheter was inserted into the uterine cavity without difficulty. Saline was infused under direct observation with the ultrasound. No intracavitary defects were noted, but she does appear to have a polyp in her upper cervix.The catheter was removed.    Chaperone was present for exam.  ASSESSMENT Menometrorrhagia, slightly elevated FSH c/w perimenopause, sonohysterogram with high endocervical polyp Severe dysmenorrhea Fibroids not in her cavity Elevated BP without diagnosis of HTN    PLAN Plan: hysteroscopy, dilation and curettage. Reviewed risks, including: bleeding, infection, uterine perforation, fluid overload, need for further sugery After hysteroscopy, would recommend cyclic provera or mirena IUD (the IUD would also help with her cramps) Recheck BP was 130/62  ~25 minutes face to face time of which over 50% was spent in counseling.

## 2019-01-07 DIAGNOSIS — M25562 Pain in left knee: Secondary | ICD-10-CM | POA: Diagnosis not present

## 2019-01-07 MED FILL — lamoTRIgine 25 MG TABS: 25 | 30 days supply | Qty: 30 | Fill #1

## 2019-01-11 DIAGNOSIS — F411 Generalized anxiety disorder: Secondary | ICD-10-CM | POA: Diagnosis not present

## 2019-01-14 ENCOUNTER — Telehealth: Payer: Self-pay | Admitting: Obstetrics and Gynecology

## 2019-01-14 NOTE — Telephone Encounter (Signed)
Routing to Dr Talbert Nan for review.   Place in hold for January 2021.    Encounter closed.

## 2019-01-14 NOTE — Telephone Encounter (Signed)
Call placed to patient to follow up in regards to  surgery plans. Patient advised she has recently had an MRI of her knee, and is scheduled to follow up with her doctor to discuss MRI results and a plan of treatment for knee issue. Patient advises she just has "too much going on with my knee" and states she will probably be looking at scheduling next year.  Forwarding to Dr Talbert Nan to review  cc: Lamont Snowball, RN

## 2019-01-16 DIAGNOSIS — S83242A Other tear of medial meniscus, current injury, left knee, initial encounter: Secondary | ICD-10-CM | POA: Diagnosis not present

## 2019-01-16 DIAGNOSIS — M25562 Pain in left knee: Secondary | ICD-10-CM | POA: Diagnosis not present

## 2019-01-22 DIAGNOSIS — S83401A Sprain of unspecified collateral ligament of right knee, initial encounter: Secondary | ICD-10-CM | POA: Diagnosis not present

## 2019-01-22 DIAGNOSIS — M5116 Intervertebral disc disorders with radiculopathy, lumbar region: Secondary | ICD-10-CM | POA: Diagnosis not present

## 2019-01-22 DIAGNOSIS — M546 Pain in thoracic spine: Secondary | ICD-10-CM | POA: Diagnosis not present

## 2019-01-22 DIAGNOSIS — M62830 Muscle spasm of back: Secondary | ICD-10-CM | POA: Diagnosis not present

## 2019-01-22 DIAGNOSIS — M542 Cervicalgia: Secondary | ICD-10-CM | POA: Diagnosis not present

## 2019-02-01 ENCOUNTER — Other Ambulatory Visit: Payer: Self-pay

## 2019-02-01 ENCOUNTER — Encounter: Payer: Self-pay | Admitting: Physical Therapy

## 2019-02-01 ENCOUNTER — Ambulatory Visit: Payer: 59 | Attending: Orthopedic Surgery | Admitting: Physical Therapy

## 2019-02-01 DIAGNOSIS — M25562 Pain in left knee: Secondary | ICD-10-CM | POA: Insufficient documentation

## 2019-02-01 DIAGNOSIS — F411 Generalized anxiety disorder: Secondary | ICD-10-CM | POA: Diagnosis not present

## 2019-02-01 NOTE — Therapy (Signed)
[place], [place], [address] Phone: [phone]   Fax:  [phone]  Physical Therapy Evaluation  Patient Details  Name: Cindy Andrade MRN: [id number] Date of Birth: [birth year] Referring Provider (PT): Nicholes Stairs, MD   Encounter Date: 02/01/2019  PT End of Session - 02/01/19 0756    Visit Number  1    PT Start Time  0715    PT Stop Time  [id number]    PT Time Calculation (min)  40 min    Activity Tolerance  Patient tolerated treatment well    Behavior During Therapy  [facility] for tasks assessed/performed       Past Medical History:  Diagnosis Date  . Abnormal uterine bleeding   . Anemia   . Anxiety   . Arthritis   . Asthma   . Chicken pox   . Depression   . Dysmenorrhea   . Fibroid   . Hay fever   . Irritable bowel syndrome with both constipation and diarrhea 02/02/2014  . Obstructive sleep apnea syndrome 02/02/2014    Past Surgical History:  Procedure Laterality Date  . ANTERIOR CRUCIATE LIGAMENT REPAIR    . EYE SURGERY Bilateral   . LAPAROSCOPIC BILATERAL SALPINGECTOMY    . LAPAROSCOPIC CHOLECYSTECTOMY  2013    There were no vitals filed for this visit.   Subjective Assessment - 02/01/19 0719    Subjective  Bil ACL replacement 13 &11 years ago. one day I was baking a cake and my knee became very swollen without incident. ACL grafts are fine  but MRI showed meniscus tear in Lt knee. It clicks every time I get up.         [facility] PT Assessment - 02/01/19 0001      Assessment   Medical Diagnosis  Lt meniscus tear    Referring Provider (PT)  Nicholes Stairs, MD      Precautions   Precautions  None      Restrictions   Weight Bearing Restrictions  No      Balance Screen   Has the patient fallen in the past 6 months  No      Prior Function   Level of Independence  Independent    Vocation  Full time employment      Cognition   Overall Cognitive Status  Within  Functional Limits for tasks assessed      Sensation   Additional Comments  WFL      ROM / Strength   AROM / PROM / Strength  Strength      Strength   Overall Strength Comments  knee & ankle 5/5, Lt hip abd 4/5                Objective measurements completed on examination: See above findings.      [facility] Adult PT Treatment/Exercise - 02/01/19 0001      Exercises   Exercises  Other Exercises    Other Exercises   see scanned pt instructions             PT Education - 02/01/19 1117    Education Details  anatomy of condition, exercise form/rationale    Person(s) Educated  Patient    Methods  Explanation;Demonstration;Tactile cues;Verbal cues;Handout    Comprehension  Verbalized understanding;Returned demonstration;Verbal cues required;Tactile cues required                  Plan - 02/01/19 1118    Clinical Impression Statement  Pt presents to PT for one-time visit for HEP to address recent tear of meniscus. She will be changing insurances soon so will return if needed and as able with new insurance. Provided with HEP for proximal strengthening and was able to demo with proper form. Encouraged her to contact us with any further question.    Stability/Clinical Decision Making  Stable/Uncomplicated    Clinical Decision Making  Low    PT Frequency  --   one-time visit   PT Treatment/Interventions  ADLs/Self Care Home Management;Therapeutic exercise    PT Home Exercise Plan  see scanned pt instructions    Consulted and Agree with Plan of Care  Patient       Patient will benefit from skilled therapeutic intervention in order to improve the following deficits and impairments:  Pain, Decreased strength  Visit Diagnosis: Acute pain of left knee - Plan: PT plan of care cert/re-cert     Problem List Patient Active Problem List   Diagnosis Date Noted  . Low ferritin 11/13/2018  . History of uterine fibroid 11/13/2018  . Labile mood 11/13/2018  .  Chronic pain of right ankle 11/11/2018  . Unstable right ankle 11/11/2018  . Psychophysiological insomnia 08/17/2018  . Excessive daytime sleepiness 06/28/2017  . Hypersomnia with sleep apnea 06/28/2017  . 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 Other parasomnia 06/28/2017  . Morbid obesity with body mass index (BMI) of 40.0 to 49.9 ([place]) 06/28/2017  . Skin lesion of face 05/01/2017  . Status post bilateral salpingectomy 08/17/2015  . Asthma 07/31/2015  . Uterine leiomyoma 07/15/2015  . Anxiety 05/08/2015  . IDA (iron deficiency anemia) 05/08/2015  . Cervical disc disease 02/13/2014  . Thyroid nodule 02/13/2014  . Obstructive sleep apnea syndrome 02/02/2014  . GERD (gastroesophageal reflux disease) 02/02/2014  . Irritable bowel syndrome with both constipation and diarrhea 02/02/2014  . MDD (recurrent major depressive disorder) in remission ([place]) 02/02/2014  . Morbid obesity ([place]) 02/02/2014  . Panic disorder 02/02/2014    Charlotte Fidalgo C. Tavi Gaughran PT, DPT 02/01/19 11:27 AM   [facility] [address] [place], [place], [address] Phone: [phone]   Fax:  [phone]  Name: Cindy Andrade MRN: [id number] Date of Birth: 1975-09-17

## 2019-02-04 MED FILL — clonazePAM 0.5 MG TABS: 0.5 | 15 days supply | Qty: 30 | Fill #1

## 2019-02-04 MED FILL — lamoTRIgine 25 MG TABS: 25 | 30 days supply | Qty: 30 | Fill #2

## 2019-02-12 MED FILL — clonazePAM 0.5 MG TABS: 0.5 | 15 days supply | Qty: 30 | Fill #1

## 2019-02-12 MED FILL — lamoTRIgine 25 MG TABS: 25 | 30 days supply | Qty: 30 | Fill #2

## 2019-02-18 MED FILL — VIIBRYD 40 MG TABLET: 40 | 90 days supply | Qty: 90 | Fill #3

## 2019-03-01 DIAGNOSIS — M546 Pain in thoracic spine: Secondary | ICD-10-CM | POA: Diagnosis not present

## 2019-03-01 DIAGNOSIS — M62830 Muscle spasm of back: Secondary | ICD-10-CM | POA: Diagnosis not present

## 2019-03-01 DIAGNOSIS — M542 Cervicalgia: Secondary | ICD-10-CM | POA: Diagnosis not present

## 2019-03-01 DIAGNOSIS — M5116 Intervertebral disc disorders with radiculopathy, lumbar region: Secondary | ICD-10-CM | POA: Diagnosis not present

## 2019-03-08 DIAGNOSIS — F411 Generalized anxiety disorder: Secondary | ICD-10-CM | POA: Diagnosis not present

## 2019-03-15 ENCOUNTER — Other Ambulatory Visit: Payer: Self-pay | Admitting: Family Medicine

## 2019-03-15 DIAGNOSIS — F3340 Major depressive disorder, recurrent, in remission, unspecified: Secondary | ICD-10-CM

## 2019-03-15 MED FILL — clonazePAM 0.5 MG TABS: 0.5 | 15 days supply | Qty: 30 | Fill #2

## 2019-03-19 ENCOUNTER — Telehealth: Payer: Self-pay

## 2019-03-19 NOTE — Telephone Encounter (Signed)
Call placed to patient to follow upon surgery planning for Hysteroscopy/polyp resection/D&C. Left voicemail message requesting a return call.

## 2019-03-20 NOTE — Telephone Encounter (Signed)
Please review

## 2019-03-21 ENCOUNTER — Other Ambulatory Visit: Payer: Self-pay

## 2019-03-21 ENCOUNTER — Ambulatory Visit (INDEPENDENT_AMBULATORY_CARE_PROVIDER_SITE_OTHER): Payer: 59 | Admitting: Physician Assistant

## 2019-03-21 ENCOUNTER — Encounter: Payer: Self-pay | Admitting: Physician Assistant

## 2019-03-21 DIAGNOSIS — F3340 Major depressive disorder, recurrent, in remission, unspecified: Secondary | ICD-10-CM | POA: Diagnosis not present

## 2019-03-21 MED ORDER — LAMOTRIGINE 25 MG PO TABS: 25.0000 mg | ORAL_TABLET | Freq: Every day | ORAL | 0 refills | Status: AC

## 2019-03-21 MED FILL — lamoTRIgine 25 MG TABS: 25 | 90 days supply | Qty: 90 | Fill #0

## 2019-03-21 NOTE — Progress Notes (Signed)
Virtual Visit via Video   I connected with Cindy Andrade on 03/21/19 at  7:40 AM EST by a video enabled telemedicine application and verified that I am speaking with the correct person using two identifiers. Location patient: Home Location provider: [facility], Office Persons participating in the virtual visit: Brelyn, Bonito PA-C,Donna Orphanos, LPN   I discussed the limitations of evaluation and management by telemedicine and the availability of in person appointments. The patient expressed understanding and agreed to proceed.  I acted as a Education administrator for [employer], PA-C [employer], LPN  Subjective:   HPI:   Anxiety & Depression Pt following up, currently taking Lamictal 25 mg daily, Viibryd 40 mg at bedtime and Klonopin 0.5 mg at bedtime. Pt states she is doing well. She is hoping to find a new primary care provider soon, states that she is now a [employer] employee and it is more cost effective for her to see a [facility] PCP. Denies SI/HI.  GAD 7 : Generalized Anxiety Score 03/21/2019 08/17/2018  Nervous, Anxious, on Edge 0 1  Control/stop worrying 0 1  Worry too much - different things 1 1  Trouble relaxing 0 1  Restless 0 0  Easily annoyed or irritable 0 1  Afraid - awful might happen 0 0  Total GAD 7 Score 1 5  Anxiety Difficulty Not difficult at all Somewhat difficult    Depression screen [facility] 2/9 03/21/2019 11/13/2018 09/28/2018 08/17/2018 05/29/2018  Decreased Interest 0 1 0 1 0  Down, Depressed, Hopeless 0 1 1 1 1   PHQ - 2 Score 0 2 1 2 1   Altered sleeping 1 0 1 2 1   Tired, decreased energy 0 1 1 1  0  Change in appetite 1 3 2 3  0  Feeling bad or failure about yourself  1 1 0 0 0  Trouble concentrating 0 1 0 1 0  Moving slowly or fidgety/restless 0 0 0 0 0  Suicidal thoughts 0 0 0 0 0  PHQ-9 Score 3 8 5 9 2   Difficult doing work/chores Not difficult at all Somewhat difficult Somewhat difficult Somewhat difficult Not difficult at all      ROS: See pertinent positives and negatives per HPI.  Patient Active Problem List   Diagnosis Date Noted  . Knee pain 12/28/2018  . Low ferritin 11/13/2018  . History of uterine fibroid 11/13/2018  . Labile mood 11/13/2018  . Chronic pain of right ankle 11/11/2018  . Unstable right ankle 11/11/2018  . Psychophysiological insomnia 08/17/2018  . Excessive daytime sleepiness 06/28/2017  . Hypersomnia with sleep apnea 06/28/2017  . 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 Other parasomnia 06/28/2017  . Morbid obesity with body mass index (BMI) of 40.0 to 49.9 ([place]) 06/28/2017  . Skin lesion of face 05/01/2017  . Status post bilateral salpingectomy 08/17/2015  . Asthma 07/31/2015  . Uterine leiomyoma 07/15/2015  . Anxiety 05/08/2015  . IDA (iron deficiency anemia) 05/08/2015  . Cervical disc disease 02/13/2014  . Thyroid nodule 02/13/2014  . Obstructive sleep apnea syndrome 02/02/2014  . GERD (gastroesophageal reflux disease) 02/02/2014  . Irritable bowel syndrome with both constipation and diarrhea 02/02/2014  . MDD (recurrent major depressive disorder) in remission ([place]) 02/02/2014  . Morbid obesity ([place]) 02/02/2014  . Panic disorder 02/02/2014    Social History   Tobacco Use  . Smoking status: Never Smoker  . Smokeless tobacco: Never Used  Substance Use Topics  . Alcohol use: Yes  Comment: Occasionally    Current Outpatient Medications:  .  clonazePAM (KLONOPIN) 0.5 MG tablet, Take 1 tablet (0.5 mg total) by mouth 2 (two) times daily as needed for anxiety., Disp: 30 tablet, Rfl: 2 .  dicyclomine (BENTYL) 20 MG tablet, Take 20 mg by mouth as needed. , Disp: , Rfl:  .  DUEXIS 800-26.6 MG TABS, Take 1 tablet by mouth as needed. , Disp: , Rfl:  .  ipratropium (ATROVENT) 0.06 % nasal spray, Place 2 sprays into both nostrils 4 (four) times daily., Disp: 15 mL, Rfl: 0 .  lamoTRIgine (LAMICTAL) 25 MG tablet, Take 1 tablet (25 mg total) by mouth daily., Disp: 90  tablet, Rfl: 0 .  loratadine-pseudoephedrine (CLARITIN-D 12-HOUR) 5-120 MG tablet, Take 1 tablet by mouth daily., Disp: , Rfl:  .  Vilazodone HCl (VIIBRYD) 40 MG TABS, Take 1 tablet (40 mg total) by mouth daily., Disp: 90 tablet, Rfl: 3  Allergies  Allergen Reactions  . Bacitracin Rash  . Other     Dermabond  . Neomycin Rash    Objective:   VITALS: Per patient if applicable, see vitals. GENERAL: Alert, appears well and in no acute distress. HEENT: Atraumatic, conjunctiva clear, no obvious abnormalities on inspection of external nose and ears. NECK: Normal movements of the head and neck. CARDIOPULMONARY: No increased WOB. Speaking in clear sentences. I:E ratio WNL.  MS: Moves all visible extremities without noticeable abnormality. PSYCH: Pleasant and cooperative, well-groomed. Speech normal rate and rhythm. Affect is appropriate. Insight and judgement are appropriate. Attention is focused, linear, and appropriate.  NEURO: CN grossly intact. Oriented as arrived to appointment on time with no prompting. Moves both UE equally.  SKIN: No obvious lesions, wounds, erythema, or cyanosis noted on face or hands.  Assessment and Plan:   Cindy Andrade was seen today for anxiety and depression.  Diagnoses and all orders for this visit:  MDD (recurrent major depressive disorder) in remission ([place]) Overall doing very well. I have sent in a 90 days supply of her lamictal. Recommended close follow-up with new PCP if/when she is ready for a change, establishing soon in case she needs anything in the near future. Orders: -     lamoTRIgine (LAMICTAL) 25 MG tablet; Take 1 tablet (25 mg total) by mouth daily.   . Reviewed expectations re: course of current medical issues. . Discussed self-management of symptoms. . Outlined signs and symptoms indicating need for more acute intervention. . Patient verbalized understanding and all questions were answered. Marland Kitchen Health Maintenance issues including appropriate  healthy diet, exercise, and smoking avoidance were discussed with patient. . See orders for this visit as documented in the electronic medical record.  I discussed the assessment and treatment plan with the patient. The patient was provided an opportunity to ask questions and all were answered. The patient agreed with the plan and demonstrated an understanding of the instructions.   The patient was advised to call back or seek an in-person evaluation if the symptoms worsen or if the condition fails to improve as anticipated.   CMA or LPN served as scribe during this visit. History, Physical, and Plan performed by medical provider. The above documentation has been reviewed and is accurate and complete.   [place], [place] 03/21/2019

## 2019-04-08 NOTE — Telephone Encounter (Signed)
Left message to call Lorien Shingler at [phone]. 

## 2019-04-10 NOTE — Telephone Encounter (Signed)
Dr.Jertson, I have attempted reaching this patient x 2 with no return calls to follow up on scheduling surgery. Please advise next steps.

## 2019-04-10 NOTE — Telephone Encounter (Signed)
Please send her a letter

## 2019-04-11 ENCOUNTER — Telehealth: Payer: Self-pay | Admitting: Obstetrics and Gynecology

## 2019-04-11 NOTE — Telephone Encounter (Signed)
Routing to [place].

## 2019-04-11 NOTE — Telephone Encounter (Signed)
Left message to call Kaitlyn at [phone]. 

## 2019-04-11 NOTE — Telephone Encounter (Signed)
Patient states she is returning a call to [place]. No open phone note.

## 2019-04-11 NOTE — Telephone Encounter (Signed)
Letter mailed to patient's home address on file.   Cc: Hayley Carder  Routing to provider and will close encounter.

## 2019-04-11 NOTE — Telephone Encounter (Signed)
Letter pending review.

## 2019-04-11 NOTE — Telephone Encounter (Signed)
Spoke with patient. Patient states that she has changed employers and she has new insurance. Will need to establish care in network and cannot proceed with surgery at our office. Advised patient of the importance for follow up care and evaluation. Needs to establish care as soon as possible. Patient verbalizes understanding and will call to request record transfer as needed.  Cc: Hayley Carder  Routing to provider and will close encounter.

## 2019-04-30 ENCOUNTER — Encounter: Payer: Self-pay | Admitting: Neurology

## 2019-05-02 ENCOUNTER — Encounter: Payer: Self-pay | Admitting: Neurology

## 2019-05-02 ENCOUNTER — Other Ambulatory Visit: Payer: Self-pay

## 2019-05-02 ENCOUNTER — Ambulatory Visit: Payer: PRIVATE HEALTH INSURANCE | Admitting: Neurology

## 2019-05-02 VITALS — BP 113/76 | HR 70 | Temp 97.6°F | Ht 64.0 in | Wt 256.0 lb

## 2019-05-02 DIAGNOSIS — F411 Generalized anxiety disorder: Secondary | ICD-10-CM

## 2019-05-02 DIAGNOSIS — Z9989 Dependence on other enabling machines and devices: Secondary | ICD-10-CM

## 2019-05-02 DIAGNOSIS — K2100 Gastro-esophageal reflux disease with esophagitis, without bleeding: Secondary | ICD-10-CM | POA: Diagnosis not present

## 2019-05-02 DIAGNOSIS — F419 Anxiety disorder, unspecified: Secondary | ICD-10-CM

## 2019-05-02 DIAGNOSIS — G4733 Obstructive sleep apnea (adult) (pediatric): Secondary | ICD-10-CM

## 2019-05-02 DIAGNOSIS — F410 Panic disorder [episodic paroxysmal anxiety] without agoraphobia: Secondary | ICD-10-CM

## 2019-05-02 NOTE — Progress Notes (Signed)
SLEEP MEDICINE CLINIC   Provider:  Larey Seat, M.D.   Primary Care Physician:  Nickola Major, MD   Referring Provider: Inda Coke, PA    Chief Complaint  Patient presents with  . Follow-up    pt alone, rm 11 pt states that she has noticed more in the last year and she thinks could be anxiety related that she feels the cpap during the night can trigger anxiety. she is having increase in daytime sleepiness which she didnt before.      Interval History : 05-02-2019, Cindy Andrade is a 44 y.o. female patient, and she underwent a sleep study by [facility]PL[facility] protocol, dated 06-29-2017; She remained hypersomnia for a long time but used CPAP compliantly. She has gained weight over the last 12 month, reaching a BMI of 43.9 again, and her anxiety has surged during the pandemic. 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 Derousse endorsed today the Epworth Sleepiness Scale at 10 out of 24 points which is lower than the previous visits, she also endorsed the fatigue severity score at 35 points.  Her compliance has been excellent 90% by days 75% by hours she uses the CPAP on average 5 hours 49 minutes at night, and she is using an AutoSet between 7 and 12 cmH2O pressure window with 3 cm expiratory pressure relief.  Her AHI is 2.0 which speaks for an excellent resolution her pressure needs have increased in comparison to previous years her 95th percentile is now 11.1 and this may be correlated to weight gain.  Based on this I would like to set the maximum pressure of her CPAP machine to 14 to that she has a little bit room.  Air leakage is irregular there may be some nights with the mask slipped however there is no baseline leak.    The patient sees a counselor for GAD, and she has ben prescribed Klonopin by PCP. I like for her to see a psychiatrist as she now takes several psychotriopic Medications. 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        Interval History: 02-14-2018, I have the pleasure of seeing Cindy Andrade today in a revisit I have access to her CPAP compliance data. Baseline AHI was 54.7/h.  She has been using an AutoSet and she is using her CPAP but she has significant daytime sleepiness nonetheless.  Her compliance was 97% by days, 80% by time with an average user time of 5 hours and 44 minutes.  Her AHI residual is 2.4/h which is an excellent resolution of apnea, she is using an AutoSet between 7 and 12 cm water-with an expiratory pressure relief of 3 cmH2O.  95th percentile pressure is at 11.4, and she has no major air leakage.  She sleeps truly only 5 hours on CPAP- she almost had a MVA due to sleepiness.  Blood sugar levels have been stabile. BP has been great. RRR.  We need to rule out other causes of excessive daytime sleepiness.      HPI:  Cindy Andrade is a 44 y.o. female patient, and she underwent a sleep study by SPLIT protocol, dated 06-29-2017;Her sleep study documented first a history of excessive daytime sleepiness affecting work and safety of driving and the Epworth sleepiness score was endorsed at 17 out of 24 points, at the time her BMI was about 44 kg/m, the patient sleep study documented an AHI of 29.5, with a strong supine  sleep accentuation to an AHI of 64.7.  Since there was no REM sleep recorded we could not see if sleep apnea was also REM sleep dependent or accentuated.  She did not have prolonged oxygen desaturations only 10-minute total desaturation time and a sleep time of 130 minutes.  Very few periodic limb movements normal sinus rhythm per EKG.  Titration started at 5 cmH2O and was advanced to 11 at 11 cmH2O she experienced an AHI of 0.0.  The technologist had given her a Respironics DreamWear full facemask in small size.  Sleep efficiency was lower on CPAP done during the baseline study which is expected in the first night on CPAP.  There was a complete resolution of the apnea at 11 cmH2O  pressure.  The patient has been by tried several masks but settled on this model.  She has been 97% compliant for the last 30 days is a download being dated 08 October 2017.  Average use of time on days used is 5 hours 7 minutes, CPAP is set at 11 cmH2O pressure with 3 cm EPR there are minor air leaks only but to my surprise there is still an AHI of 4.6.  It seems that the apneas were mostly obstructive in nature 2.3 of the residual AHI.  No Cheyne-Stokes respirations.  She responded well in improving her sleepiness, and was very safe while driving, felt alert. She started a weight loss program and lost 20 pounds in the meantime. Her sleepiness became again more noticable about 10- 14 days ago, she is unsure why. There are no major leaks. She is still trying different humidity , she still is a mouth breather.  We will change her to an auto setting-  7-12 cm water, 3 cm EPR.     This is our Sleep Consult - April 2019 -She was  seen here as in a referral from Dr. Morene Rankins for a new evaluation of sleep apnea, witnessed apnea and snoring per husband.  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 Morbid obesity. Memory issues. Monday left work after supervisor noted her to sleep, fell almost asleep driving.  Is a pleasure of seeing Cindy Andrade today as a new patient on 28 Jun 2017, and she reports how to start and anxious she has become in relation to her hypersomnia.  Her excessive daytime sleepiness affects her work, her social life her driving safety.  She was evaluated for sleep apnea about 9 years ago at [facility] and at the time was diagnosed with obstructive sleep apnea but could not tolerate the treatment.  She was placed on CPAP but at the time her then spouse was not very supportive and she felt that this was  another intrusion into her marital life. At the time her IBS was also affecting her.  In the meantime she is been married, her husband has witnessed her to snore and have apneas.   She sometimes has to call a friend on her cell phone while driving to keep her awake, she has trouble to concentrate at work fatigue and excessive daytime sleepiness have affected her productivity her reliability.  She is quite anxious at this time, she drinks a lot of caffeine in order to keep herself awake, but she has refrained from energy drinks.   Sleep habits are as follows: She does not have trouble going to sleep, she usually tries to keep her brain active by playing on her phone possible games etc. and this also helps her to get a  buffer between her and her revolving thoughts .  She describes her marital bedroom is cool, quiet and dark, she is asleep was less than 10 minutes once she goes to bed about 9.45 PM  She has a bedroom with her husband.  He comes to bed later , by around one hour. She does avoid supine sleep position because she wakes up gasping, however she sometimes still inadvertently able to resume supine sleep.  She has tried to train herself to sleep on her side.  Because of back pain she is not able to sleep prone.  In order to prevent cervicalgia she is using an orthopedic pillow and one between the legs.  Waking up at 5.30 alarm,  She feels she needs several alarms, struggles to get up.  She dreams a lot, often vivid, nightmarish dreams which she interacts with, enacts. Sleep paralysis, dream intrusion. Has sleep- walking episodes. She shakes when she gest angry, but her knees buckle. Naps whenever physically not active and mentally not stimulated. Falling asleep at work.  .    Sleep medical history and family sleep history: father has OSA. He worked as a Pharmacist, community and took naps in daytime.  Social history: remarried, no children. Salpingectomy. Anxiety disorder. Loss of libido, weight gain. Has pet dogs, one sleeps in the bedroom.  Has caffeine overuse due to hypersomnia, never smoked, asthma- ETOH very seldomly.  The patient cut significantly down on caffeine- now one cup of  coffee down from 5-8. ( 11-10-2017 ). She has started a dietary plan and is losing weight- related to Medifast. She has recovered from GI problems now. HLA was negative.   Review of Systems: Out of a complete 14 system review, the patient complains of only the following symptoms, and all other reviewed systems are negative.  pre OSA diagnosis EDS- excessive sleepiness, irresistible urge to go to sleep, sleep paralysis, naps, sleep attacks. possible cataplexy.   Epworth score was 10 out of 24 on CPAP  Fatigue severity score down to 32 points from 58/ 63 points, depression score deferred.   Social History   Socioeconomic History  . Marital status: Married    Spouse name: Not on file  . Number of children: Not on file  . Years of education: Not on file  . Highest education level: Not on file  Occupational History    Employer: [employer]  . Financial resource strain: Not on file  . Food insecurity:    Worry: Not on file    Inability: Not on file  . Transportation needs:    Medical: Not on file    Non-medical: Not on file  Tobacco Use  . Smoking status: Never Smoker  . Smokeless tobacco: Never Used  Substance and Sexual Activity  . Alcohol use: Yes    Comment: Occasionally  . Drug use: Never  . Sexual activity: Not on file  Lifestyle  . Physical activity:    Days per week: Not on file    Minutes per session: Not on file  . Stress: Not on file  Relationships  . Social connections:    Talks on phone:     Gets together:     Attends religious service:     Active member of club or organization:     Attends meetings of clubs or organizations:     Relationship status:   . Intimate partner violence:    Fear of current or ex partner:     Emotionally abused:  Physically abused:     Forced sexual activity:   Other Topics Concern  . Not on file  Social History Narrative  . Not on file    Family History  Problem Relation Age of Onset  . Arthritis Mother     . Cancer Mother   . Breast cancer Mother   . Thyroid disease Mother   . Cancer Father   . Depression Father   . Diabetes Father   . Hearing loss Father   . High Cholesterol Father   . Kidney disease Father   . Stroke Father   . Sleep apnea Father   . Hypertension Father   . Hyperlipidemia Father   . Arthritis Maternal Grandmother   . Cancer Maternal Grandmother   . Depression Maternal Grandmother   . Breast cancer Maternal Grandmother   . Birth defects Paternal Grandmother   . High Cholesterol Paternal Grandmother   . Heart attack Paternal Grandfather   . Heart disease Paternal Grandfather   . High Cholesterol Paternal Grandfather   . Hypertension Paternal Grandfather     Past Medical History:  Diagnosis Date  . Abnormal uterine bleeding   . Anemia   . Anxiety   . Arthritis   . Asthma   . Chicken pox   . Depression   . Dysmenorrhea   . Fibroid   . Hay fever   . Irritable bowel syndrome with both constipation and diarrhea 02/02/2014  . Obstructive sleep apnea syndrome 02/02/2014    Past Surgical History:  Procedure Laterality Date  . ANTERIOR CRUCIATE LIGAMENT REPAIR    . EYE SURGERY Bilateral   . LAPAROSCOPIC BILATERAL SALPINGECTOMY    . LAPAROSCOPIC CHOLECYSTECTOMY  2013    Current Outpatient Medications  Medication Sig Dispense Refill  . clonazePAM (KLONOPIN) 0.5 MG tablet Take 1 tablet (0.5 mg total) by mouth 2 (two) times daily as needed for anxiety. (Patient taking differently: Take 0.25 mg by mouth at bedtime as needed for anxiety. ) 30 tablet 2  . dicyclomine (BENTYL) 20 MG tablet Take 20 mg by mouth as needed.     . DUEXIS 800-26.6 MG TABS Take 1 tablet by mouth as needed.     Marland Kitchen ipratropium (ATROVENT) 0.06 % nasal spray Place 2 sprays into both nostrils 4 (four) times daily. 15 mL 0  . lamoTRIgine (LAMICTAL) 25 MG tablet Take 1 tablet (25 mg total) by mouth daily. 90 tablet 0  . loratadine-pseudoephedrine (CLARITIN-D 12-HOUR) 5-120 MG tablet Take 1  tablet by mouth daily.    . Vilazodone HCl (VIIBRYD) 40 MG TABS Take 1 tablet (40 mg total) by mouth daily. 90 tablet 3   No current facility-administered medications for this visit.    Allergies as of 05/02/2019 - Review Complete 05/02/2019  Allergen Reaction Noted  . Bacitracin Rash 05/09/2017  . Other  10/11/2016  . Neomycin Rash 04/19/2015    Vitals: BP 113/76   Pulse 70   Temp 97.6 F (36.4 C)   Ht _0  (1.626 m)   Wt 256 lb (116.1 kg)   BMI 43.94 kg/m  Last Weight:  Wt Readings from Last 1 Encounters:  05/02/19 256 lb (116.1 kg)   OMB:TDHR mass index is 43.94 kg/m.     Last Height:   Ht Readings from Last 1 Encounters:  05/02/19 _1  (1.626 m)    Physical exam:  General: The patient is awake, alert and appears not in acute distress. The patient is well groomed. Head: Normocephalic, atraumatic. Neck is  supple. Mallampati 2-3 ,  nasal congestion, fractured septum . neck circumference: 16.  Retrognathia is seen.  Crowded dental status.  Cardiovascular:  Regular rate and rhythm , without  murmurs or carotid bruit, and without distended neck veins. Respiratory: Lungs are clear to auscultation.Skin:  Without evidence of edema, or rash. Trunk: BMI is down to 39.27 from 44 !Marland Kitchen The patient's posture is erect.  Neurologic exam : The patient is awake and alert, oriented to place and time. She is happy, excited about CPAP but frustrated with lack of  weight loss.  .  Memory subjective described as intact.  Attention span & concentration ability appears normal.  Speech is fluent, without dysarthria, dysphonia or aphasia.  Mood and affect are happy.  Cranial nerves: Pupils are equal, she had corrective eye surgery at age 48 - lazy eyes, strabism. Weakness of adduction on the left over right with fatigue, diplopia skewed images.   Facial motor strength is symmetric . Her tongue and uvula move midline. Shoulder shrug was symmetrical.  Motor exam:   Normal tone, muscle bulk and  symmetric strength in all extremities. Good grip strength. Sensory:  Fine touch, pinprick and vibration were  normal. Coordination: Finger-to-nose maneuver  normal without evidence of ataxia, dysmetria or tremor. Gait and station: Patient walks without assistive device. Stance is stable and normal.   Deep tendon reflexes: in the upper and lower extremities are symmetric and intact. Babinski maneuver deferred.   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 Remaining sleepy after weight loss, and after compliant CPAP use, no longer reporting sleep paralysis.  Still needs coffee ( 4 cups or more )  to get through the day.   The patient was advised of the nature of the diagnosed disorder , the treatment options and the  risks for general health and wellness arising from not treating the condition.  I spent more than 25 minutes of face to face time with the patient.Greater than 50% of time was spent in counseling and coordination of care. We have discussed the diagnosis and differential and I answered the patient's questions.    Plan:  Treatment plan and additional workup : she feels unable to undergo MSLT testing, is not able to wean off antidepressant- this terrifies her. Pre divorce and she has been stable , remarried and "doesn't want to risk rocking the boat" .   We can treat with modafinil - OSA/ hypersomnia.   We can also order a MWT- documentation of ability / inability to stay awake.   Continue auto CPAP to a new range of  7-14cm water auto setting , with 3 cm EPR. Keep mask and model.  Avoid supine sleep.    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       Larey Seat, MD 0/04/4095, 3:53 PM  Certified in Neurology by ABPN Certified in [place] by [facility] [address] [place], [place] [address]

## 2019-05-02 NOTE — Patient Instructions (Signed)
Quality Sleep Information, Adult Quality sleep is important for your mental and physical health. It also improves your quality of life. Quality sleep means you:  Are asleep for most of the time you are in bed.  Fall asleep within 30 minutes.  Wake up no more than once a night.  Are awake for no longer than 20 minutes if you do wake up during the night. Most adults need 7-8 hours of quality sleep each night. How can poor sleep affect me? If you do not get enough quality sleep, you may have:  Mood swings.  Daytime sleepiness.  Confusion.  Decreased reaction time.  Sleep disorders, such as insomnia and sleep apnea.  Difficulty with: ? Solving problems. ? Coping with stress. ? Paying attention. These issues may affect your performance and productivity at work, school, and at home. Lack of sleep may also put you at higher risk for accidents, suicide, and risky behaviors. If you do not get quality sleep you may also be at higher risk for several health problems, including:  Infections.  Type 2 diabetes.  Heart disease.  High blood pressure.  Obesity.  Worsening of long-term conditions, like arthritis, kidney disease, depression, Parkinson's disease, and epilepsy. What actions can I take to get more quality sleep?      Stick to a sleep schedule. Go to sleep and wake up at about the same time each day. Do not try to sleep less on weekdays and make up for lost sleep on weekends. This does not work.  Try to get about 30 minutes of exercise on most days. Do not exercise 2-3 hours before going to bed.  Limit naps during the day to 30 minutes or less.  Do not use any products that contain nicotine or tobacco, such as cigarettes or e-cigarettes. If you need help quitting, ask your health care provider.  Do not drink caffeinated beverages for at least 8 hours before going to bed. Coffee, tea, and some sodas contain caffeine.  Do not drink alcohol close to bedtime.  Do not  eat large meals close to bedtime.  Do not take naps in the late afternoon.  Try to get at least 30 minutes of sunlight every day. Morning sunlight is best.  Make time to relax before bed. Reading, listening to music, or taking a hot bath promotes quality sleep.  Make your bedroom a place that promotes quality sleep. Keep your bedroom dark, quiet, and at a comfortable room temperature. Make sure your bed is comfortable. Take out sleep distractions like TV, a computer, smartphone, and bright lights.  If you are lying awake in bed for longer than 20 minutes, get up and do a relaxing activity until you feel sleepy.  Work with your health care provider to treat medical conditions that may affect sleeping, such as: ? Nasal obstruction. ? Snoring. ? Sleep apnea and other sleep disorders.  Talk to your health care provider if you think any of your prescription medicines may cause you to have difficulty falling or staying asleep.  If you have sleep problems, talk with a sleep consultant. If you think you have a sleep disorder, talk with your health care provider about getting evaluated by a specialist. Where to find more information  National Sleep Foundation website: https://sleepfoundation.org  National Heart, Lung, and Blood Institute (NHLBI): www.nhlbi.nih.gov/files/docs/public/sleep/healthy_sleep.pdf  Centers for Disease Control and Prevention (CDC): www.cdc.gov/sleep/index.html Contact a health care provider if you:  Have trouble getting to sleep or staying asleep.  Often wake up   very early in the morning and cannot get back to sleep.  Have daytime sleepiness.  Have daytime sleep attacks of suddenly falling asleep and sudden muscle weakness (narcolepsy).  Have a tingling sensation in your legs with a strong urge to move your legs (restless legs syndrome).  Stop breathing briefly during sleep (sleep apnea).  Think you have a sleep disorder or are taking a medicine that is  affecting your quality of sleep. Summary  Most adults need 7-8 hours of quality sleep each night.  Getting enough quality sleep is an important part of health and well-being.  Make your bedroom a place that promotes quality sleep and avoid things that may cause you to have poor sleep, such as alcohol, caffeine, smoking, and large meals.  Talk to your health care provider if you have trouble falling asleep or staying asleep. This information is not intended to replace advice given to you by your health care provider. Make sure you discuss any questions you have with your health care provider. Document Revised: 05/24/2017 Document Reviewed: 05/24/2017 Elsevier Patient Education  2020 [employer] Insomnia Insomnia is a sleep disorder that makes it difficult to fall asleep or stay asleep. Insomnia can cause fatigue, low energy, difficulty concentrating, mood swings, and poor performance at work or school. There are three different ways to classify insomnia:  Difficulty falling asleep.  Difficulty staying asleep.  Waking up too early in the morning. Any type of insomnia can be long-term (chronic) or short-term (acute). Both are common. Short-term insomnia usually lasts for three months or less. Chronic insomnia occurs at least three times a week for longer than three months. What are the causes? Insomnia may be caused by another condition, situation, or substance, such as:  Anxiety.  Certain medicines.  Gastroesophageal reflux disease (GERD) or other gastrointestinal conditions.  Asthma or other breathing conditions.  Restless legs syndrome, sleep apnea, or other sleep disorders.  Chronic pain.  Menopause.  Stroke.  Abuse of alcohol, tobacco, or illegal drugs.  Mental health conditions, such as depression.  Caffeine.  Neurological disorders, such as Alzheimer's disease.  An overactive thyroid (hyperthyroidism). Sometimes, the cause of insomnia may not be known. What  increases the risk? Risk factors for insomnia include:  Gender. Women are affected more often than men.  Age. Insomnia is more common as you get older.  Stress.  Lack of exercise.  Irregular work schedule or working night shifts.  Traveling between different time zones.  Certain medical and mental health conditions. What are the signs or symptoms? If you have insomnia, the main symptom is having trouble falling asleep or having trouble staying asleep. This may lead to other symptoms, such as:  Feeling fatigued or having low energy.  Feeling nervous about going to sleep.  Not feeling rested in the morning.  Having trouble concentrating.  Feeling irritable, anxious, or depressed. How is this diagnosed? This condition may be diagnosed based on:  Your symptoms and medical history. Your health care provider may ask about: ? Your sleep habits. ? Any medical conditions you have. ? Your mental health.  A physical exam. How is this treated? Treatment for insomnia depends on the cause. Treatment may focus on treating an underlying condition that is causing insomnia. Treatment may also include:  Medicines to help you sleep.  Counseling or therapy.  Lifestyle adjustments to help you sleep better. Follow these instructions at home: Eating and drinking   Limit or avoid alcohol, caffeinated beverages, and cigarettes, especially close to bedtime.   These can disrupt your sleep.  Do not eat a large meal or eat spicy foods right before bedtime. This can lead to digestive discomfort that can make it hard for you to sleep. Sleep habits   Keep a sleep diary to help you and your health care provider figure out what could be causing your insomnia. Write down: ? When you sleep. ? When you wake up during the night. ? How well you sleep. ? How rested you feel the next day. ? Any side effects of medicines you are taking. ? What you eat and drink.  Make your bedroom a dark,  comfortable place where it is easy to fall asleep. ? Put up shades or blackout curtains to block light from outside. ? Use a white noise machine to block noise. ? Keep the temperature cool.  Limit screen use before bedtime. This includes: ? Watching TV. ? Using your smartphone, tablet, or computer.  Stick to a routine that includes going to bed and waking up at the same times every day and night. This can help you fall asleep faster. Consider making a quiet activity, such as reading, part of your nighttime routine.  Try to avoid taking naps during the day so that you sleep better at night.  Get out of bed if you are still awake after 15 minutes of trying to sleep. Keep the lights down, but try reading or doing a quiet activity. When you feel sleepy, go back to bed. General instructions  Take over-the-counter and prescription medicines only as told by your health care provider.  Exercise regularly, as told by your health care provider. Avoid exercise starting several hours before bedtime.  Use relaxation techniques to manage stress. Ask your health care provider to suggest some techniques that may work well for you. These may include: ? Breathing exercises. ? Routines to release muscle tension. ? Visualizing peaceful scenes.  Make sure that you drive carefully. Avoid driving if you feel very sleepy.  Keep all follow-up visits as told by your health care provider. This is important. Contact a health care provider if:  You are tired throughout the day.  You have trouble in your daily routine due to sleepiness.  You continue to have sleep problems, or your sleep problems get worse. Get help right away if:  You have serious thoughts about hurting yourself or someone else. If you ever feel like you may hurt yourself or others, or have thoughts about taking your own life, get help right away. You can go to your nearest emergency department or call:  Your local emergency services (911  in the [place]  A suicide crisis helpline, such as the National Suicide Prevention Lifeline at 1-800-273-8255. This is open 24 hours a day. Summary  Insomnia is a sleep disorder that makes it difficult to fall asleep or stay asleep.  Insomnia can be long-term (chronic) or short-term (acute).  Treatment for insomnia depends on the cause. Treatment may focus on treating an underlying condition that is causing insomnia.  Keep a sleep diary to help you and your health care provider figure out what could be causing your insomnia. This information is not intended to replace advice given to you by your health care provider. Make sure you discuss any questions you have with your health care provider. Document Revised: 01/27/2017 Document Reviewed: 11/24/2016 Elsevier Patient Education  2020 [employer]  

## 2019-05-15 ENCOUNTER — Encounter: Payer: Self-pay | Admitting: Neurology

## 2019-08-10 IMAGING — CT CT CERVICAL SPINE W/O CM
4 series · 14 of 33 positions shown, 17 images · non-contrast
Comparison: Cervical spine radiographs 10/11/2016

CLINICAL DATA: Fell and injured neck.

EXAM:
CT CERVICAL SPINE WITHOUT CONTRAST
TECHNIQUE: Multidetector CT imaging of the cervical spine was performed without
intravenous contrast. Multiplanar CT image reconstructions were also
generated.

[Series 5: c_spine 2.0 st · axial · 0.35mm/px · z∈[-259,-141]mm · 5 of 89 slices shown, 7 images]
[im 15/89  soft-tissue]
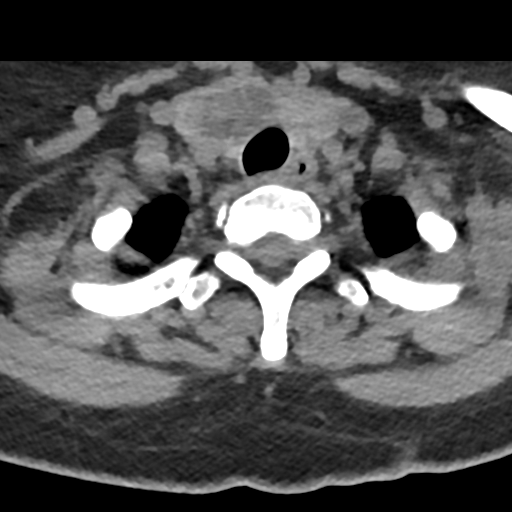
[im 15/89  bone]
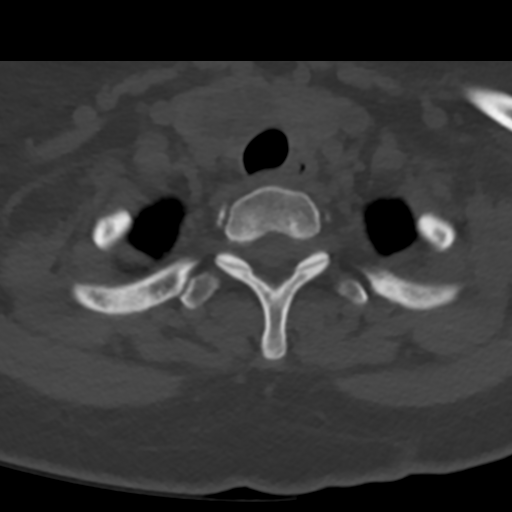
[im 30/89  bone]
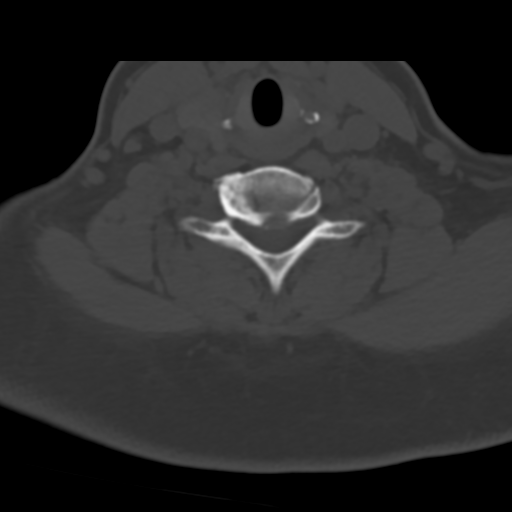
[im 45/89  bone]
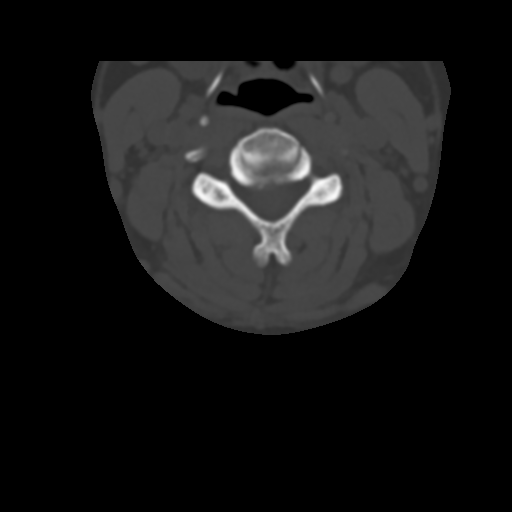
[im 59/89  bone]
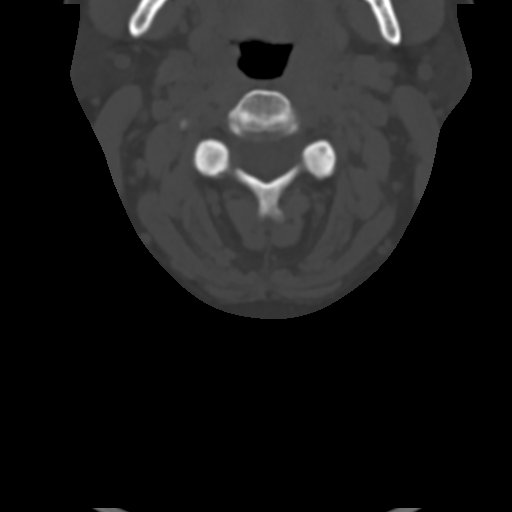
[im 74/89  soft-tissue]
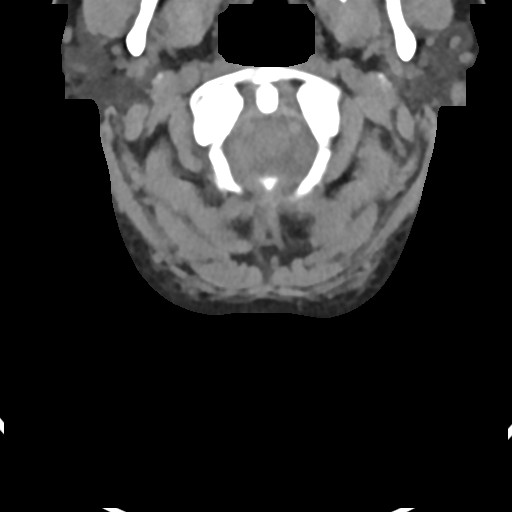
[im 74/89  bone]
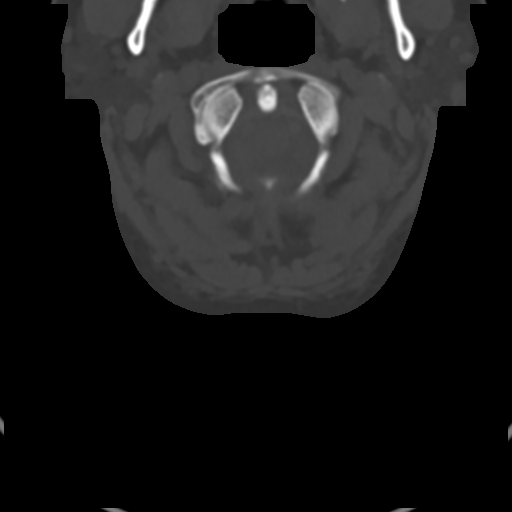

[Series 6: coronal bone · coronal · 0.26mm/px · 3 of 68 slices shown]
[im 14/68  bone]
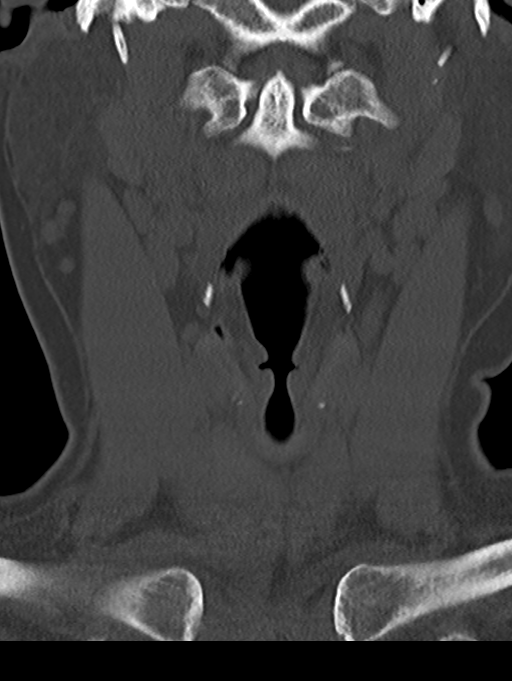
[im 27/68  bone]
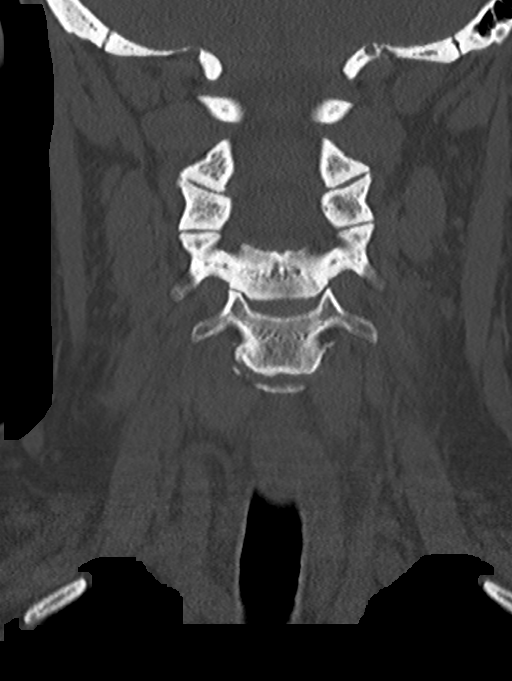
[im 41/68  bone]
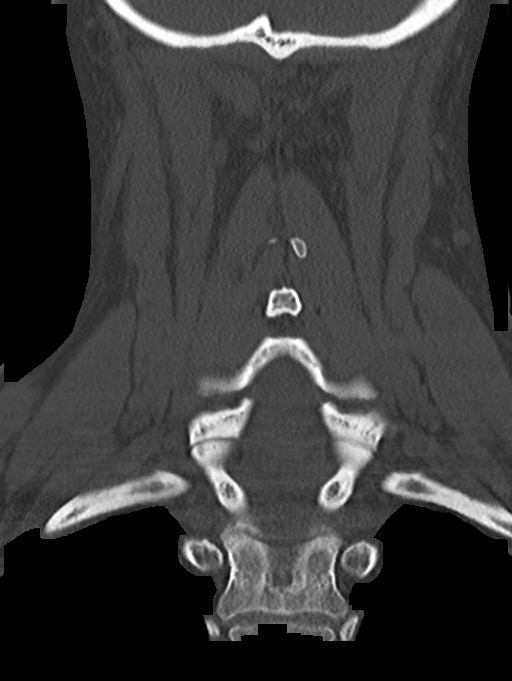

[Series 7: sagittal bone · sagittal · 0.33mm/px · 5 of 65 slices shown, 6 images]
[im 22/65  bone]
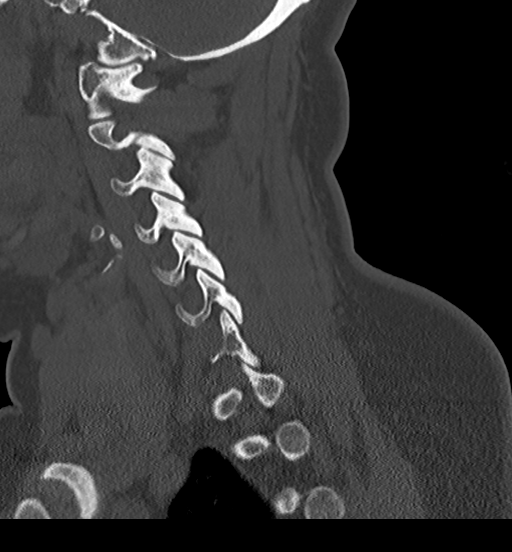
[im 27/65  bone]
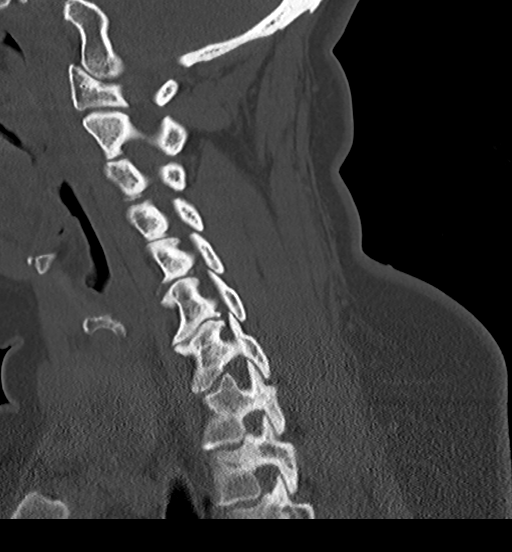
[im 33/65  soft-tissue]
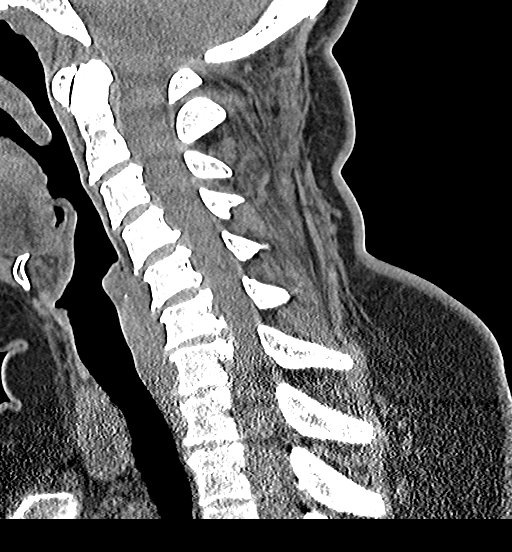
[im 33/65  bone]
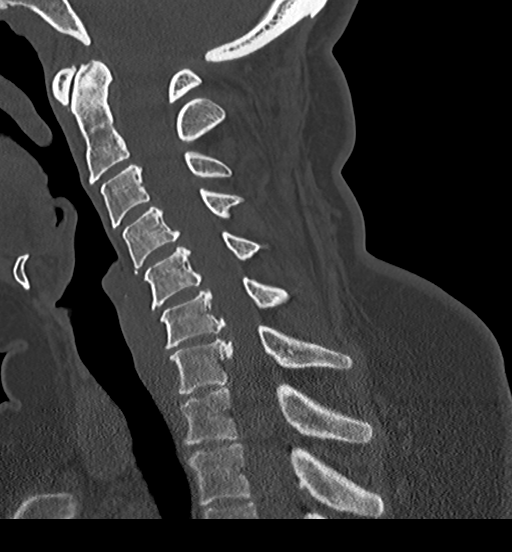
[im 38/65  bone]
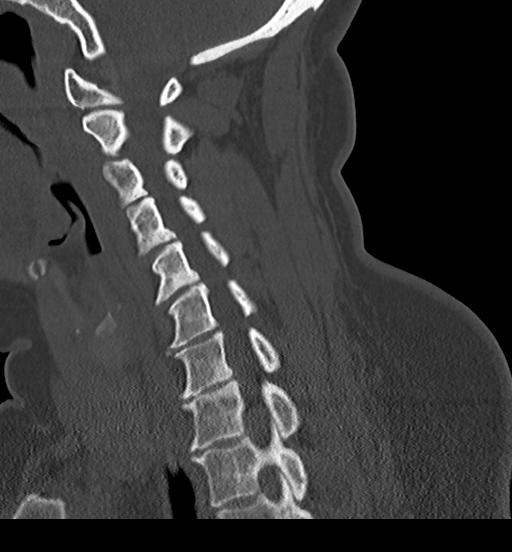
[im 43/65  bone]
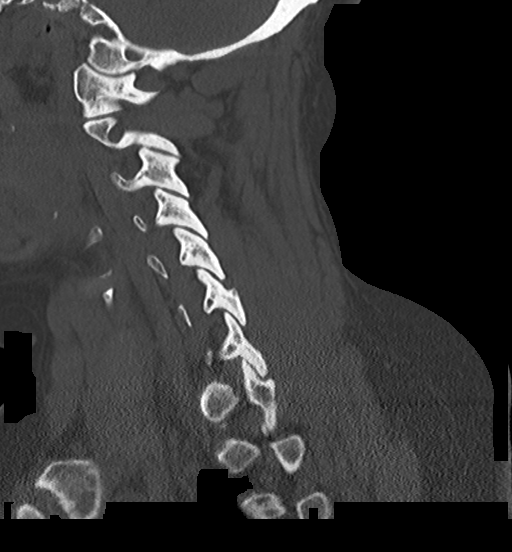

[Series 9: orthogonal st · axial · 0.21mm/px · 1 of 87 slices shown]
[im 15/87  bone]
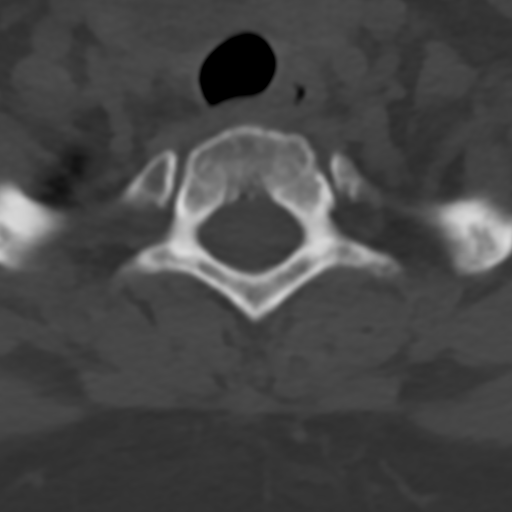

[14 of 33 positions shown; findings below may reference images not displayed]

FINDINGS: Alignment: Reversal of the normal cervical lordosis likely due to
the head for position the patient. This could also be seen with
muscle spasm or pain. The overall alignment is maintained.

Skull base and vertebrae: No acute fracture is identified.

Soft tissues and spinal canal: No abnormal prevertebral soft tissue
swelling. The spinal canal is generous in is no significant spinal
stenosis.

Disc levels: No significant disc protrusions or canal stenosis.
Advanced degenerative changes for age with osteophytic
spurring/marginal osteophytes. Significant uncinate spurring changes
on the right side the the at C6-7 with moderate right foraminal
stenosis.

Midline calcifications suspicious for early OPLL.

Upper chest: No significant findings.

Other: 3 cm right thyroid mass. Recommend ultrasound correlation and
consideration for biopsy.
IMPRESSION: 1. Reversal of the normal cervical lordosis likely due to
positioning, muscle spasm or pain. The overall alignment is
maintained.
2. Age advanced degenerative changes but no acute fracture.
3. Significant uncinate spurring changes on the right at C6-7 with
moderate right foraminal stenosis.
4. **An incidental finding of potential clinical significance has
been found. 3 cm right thyroid lobe mass. Recommend ultrasound
correlation and consideration for biopsy.**

## 2020-05-04 ENCOUNTER — Ambulatory Visit: Payer: PRIVATE HEALTH INSURANCE | Admitting: Neurology
